# Patient Record
Sex: Male | Born: 1980
Health system: Southern US, Community
[De-identification: ages and names within clinical notes are randomized; demographics above are authoritative.]

## PROBLEM LIST (undated history)

## (undated) DIAGNOSIS — K759 Inflammatory liver disease, unspecified: Secondary | ICD-10-CM

## (undated) DIAGNOSIS — G9340 Encephalopathy, unspecified: Secondary | ICD-10-CM

## (undated) DIAGNOSIS — Z87442 Personal history of urinary calculi: Secondary | ICD-10-CM

## (undated) DIAGNOSIS — I2510 Atherosclerotic heart disease of native coronary artery without angina pectoris: Secondary | ICD-10-CM

## (undated) DIAGNOSIS — G4730 Sleep apnea, unspecified: Secondary | ICD-10-CM

## (undated) DIAGNOSIS — R569 Unspecified convulsions: Secondary | ICD-10-CM

## (undated) DIAGNOSIS — R002 Palpitations: Secondary | ICD-10-CM

## (undated) DIAGNOSIS — I10 Essential (primary) hypertension: Secondary | ICD-10-CM

---

## 2010-01-23 ENCOUNTER — Inpatient Hospital Stay (HOSPITAL_COMMUNITY): Admission: EM | Admit: 2010-01-23 | Discharge: 2010-01-26 | Payer: Self-pay | Admitting: Emergency Medicine

## 2010-01-23 ENCOUNTER — Ambulatory Visit: Payer: Self-pay | Admitting: Infectious Disease

## 2010-01-25 ENCOUNTER — Encounter (INDEPENDENT_AMBULATORY_CARE_PROVIDER_SITE_OTHER): Payer: Self-pay | Admitting: Internal Medicine

## 2010-02-15 ENCOUNTER — Ambulatory Visit: Payer: Self-pay | Admitting: Infectious Disease

## 2010-02-15 DIAGNOSIS — G0491 Myelitis, unspecified: Secondary | ICD-10-CM

## 2010-02-15 DIAGNOSIS — G0490 Encephalitis and encephalomyelitis, unspecified: Secondary | ICD-10-CM | POA: Insufficient documentation

## 2010-02-15 DIAGNOSIS — Z8669 Personal history of other diseases of the nervous system and sense organs: Secondary | ICD-10-CM

## 2010-02-15 DIAGNOSIS — B2790 Infectious mononucleosis, unspecified without complication: Secondary | ICD-10-CM

## 2010-02-15 DIAGNOSIS — B199 Unspecified viral hepatitis without hepatic coma: Secondary | ICD-10-CM | POA: Insufficient documentation

## 2010-02-15 LAB — CONVERTED CEMR LAB
ALT: 57 units/L — ABNORMAL HIGH (ref 0–53)
AST: 25 units/L (ref 0–37)
Albumin: 4.5 g/dL (ref 3.5–5.2)
Alkaline Phosphatase: 71 units/L (ref 39–117)
BUN: 17 mg/dL (ref 6–23)
Basophils Absolute: 0 10*3/uL (ref 0.0–0.1)
CO2: 23 meq/L (ref 19–32)
Calcium: 9.4 mg/dL (ref 8.4–10.5)
Chloride: 100 meq/L (ref 96–112)
Eosinophils Absolute: 0.3 10*3/uL (ref 0.0–0.7)
Eosinophils Relative: 3 % (ref 0–5)
HCT: 42.2 % (ref 39.0–52.0)
Hemoglobin: 14.3 g/dL (ref 13.0–17.0)
MCHC: 33.9 g/dL (ref 30.0–36.0)
MCV: 86.7 fL (ref >=78.0)
Monocytes Absolute: 0.9 10*3/uL (ref 0.1–1.0)
Sodium: 137 meq/L (ref 135–145)
Total Bilirubin: 1.1 mg/dL (ref 0.3–1.2)

## 2010-02-17 ENCOUNTER — Telehealth (INDEPENDENT_AMBULATORY_CARE_PROVIDER_SITE_OTHER): Payer: Self-pay | Admitting: *Deleted

## 2010-12-21 NOTE — Progress Notes (Signed)
Summary: pt given lab results  Phone Note Call from Patient Call back at Home Phone [phone]   Caller: Patient Reason for Call: Lab or Test Results Summary of Call: gave pt. CBC and CMET results Wendall Mola CMA Duncan Dull)  February 17, 2010 4:52 PM  Initial call taken by: Wendall Mola CMA Duncan Dull),  February 17, 2010 4:52 PM
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
## 2010-12-21 NOTE — Letter (Signed)
Summary: Generic Letter  All     ,     Phone:   Fax:     02/15/2010  Ann Held [address] [place], [place]  [address]  To whom it may concern:  Mr Gulbranson has been seen and evaluated and treated by me both in the hospital and in my clinic. He was suitable to return to work on March the 2nd. If you have any further questions please do not hesitate to contact my office          Sincerely,   Acey Lav MD

## 2011-02-14 LAB — BASIC METABOLIC PANEL
BUN: 11 mg/dL (ref 6–23)
CO2: 23 mEq/L (ref 19–32)
Chloride: 100 mEq/L (ref 96–112)
Glucose, Bld: 138 mg/dL — ABNORMAL HIGH (ref 70–99)
Potassium: 4.2 mEq/L (ref 3.5–5.1)

## 2011-02-14 LAB — COMPREHENSIVE METABOLIC PANEL
ALT: 580 U/L — ABNORMAL HIGH (ref 0–53)
AST: 150 U/L — ABNORMAL HIGH (ref 0–37)
AST: 180 U/L — ABNORMAL HIGH (ref 0–37)
Albumin: 3.1 g/dL — ABNORMAL LOW (ref 3.5–5.2)
Alkaline Phosphatase: 226 U/L — ABNORMAL HIGH (ref 39–117)
Alkaline Phosphatase: 245 U/L — ABNORMAL HIGH (ref 39–117)
Alkaline Phosphatase: 270 U/L — ABNORMAL HIGH (ref 39–117)
BUN: 10 mg/dL (ref 6–23)
BUN: 9 mg/dL (ref 6–23)
CO2: 27 mEq/L (ref 19–32)
CO2: 30 mEq/L (ref 19–32)
Calcium: 9 mg/dL (ref 8.4–10.5)
Chloride: 102 mEq/L (ref 96–112)
Chloride: 103 mEq/L (ref 96–112)
Chloride: 104 mEq/L (ref 96–112)
Creatinine, Ser: 0.77 mg/dL (ref 0.4–1.5)
GFR calc Af Amer: >60 mL/min (ref >60)
GFR calc Af Amer: >60 mL/min (ref >60)
GFR calc non Af Amer: >60 mL/min (ref >60)
GFR calc non Af Amer: >60 mL/min (ref >60)
Glucose, Bld: 109 mg/dL — ABNORMAL HIGH (ref 70–99)
Potassium: 3.9 mEq/L (ref 3.5–5.1)
Potassium: 4 mEq/L (ref 3.5–5.1)
Potassium: 4.2 mEq/L (ref 3.5–5.1)
Sodium: 136 mEq/L (ref 135–145)
Sodium: 140 mEq/L (ref 135–145)
Total Bilirubin: 1.7 mg/dL — ABNORMAL HIGH (ref 0.3–1.2)
Total Bilirubin: 1.8 mg/dL — ABNORMAL HIGH (ref 0.3–1.2)
Total Bilirubin: 2.8 mg/dL — ABNORMAL HIGH (ref 0.3–1.2)
Total Protein: 6.2 g/dL (ref 6.0–8.3)

## 2011-02-14 LAB — RAPID URINE DRUG SCREEN, HOSP PERFORMED
Barbiturates: NOT DETECTED
Benzodiazepines: POSITIVE — AB
Cocaine: NOT DETECTED
Opiates: NOT DETECTED

## 2011-02-14 LAB — URINE MICROSCOPIC-ADD ON

## 2011-02-14 LAB — POCT I-STAT, CHEM 8
Chloride: 103 mEq/L (ref 96–112)
Creatinine, Ser: 1.2 mg/dL (ref 0.4–1.5)
Glucose, Bld: 142 mg/dL — ABNORMAL HIGH (ref 70–99)
Potassium: 4 mEq/L (ref 3.5–5.1)
TCO2: 26 mmol/L (ref 0–100)

## 2011-02-14 LAB — DIFFERENTIAL
Basophils Absolute: 0 10*3/uL (ref 0.0–0.1)
Basophils Absolute: 0.2 10*3/uL — ABNORMAL HIGH (ref 0.0–0.1)
Basophils Relative: 0 % (ref 0–1)
Eosinophils Absolute: 0 10*3/uL (ref 0.0–0.7)
Eosinophils Absolute: 0 10*3/uL (ref 0.0–0.7)
Lymphocytes Relative: 67 % — ABNORMAL HIGH (ref 12–46)
Lymphs Abs: 8 10*3/uL — ABNORMAL HIGH (ref 0.7–4.0)
Monocytes Absolute: 0.7 10*3/uL (ref 0.1–1.0)
Monocytes Relative: 4 % (ref 3–12)
Neutro Abs: 4.8 10*3/uL (ref 1.7–7.7)
Neutro Abs: 7.3 10*3/uL (ref 1.7–7.7)
Neutrophils Relative %: 24 % — ABNORMAL LOW (ref 43–77)
Promyelocytes Absolute: 0 %

## 2011-02-14 LAB — EPSTEIN-BARR VIRUS VCA ANTIBODY PANEL
EBV VCA IgG: 0.59 {ISR}
EBV VCA IgM: 4.08 {ISR} — ABNORMAL HIGH

## 2011-02-14 LAB — CBC
HCT: 41.9 % (ref 39.0–52.0)
HCT: 42.1 % (ref 39.0–52.0)
HCT: 49 % (ref 39.0–52.0)
Hemoglobin: 14.7 g/dL (ref 13.0–17.0)
MCHC: 33.9 g/dL (ref 30.0–36.0)
MCHC: 35 g/dL (ref 30.0–36.0)
MCHC: 35.1 g/dL (ref 30.0–36.0)
MCV: 88.7 fL (ref 78.0–100.0)
MCV: 89 fL (ref 78.0–100.0)
MCV: 89.5 fL (ref 78.0–100.0)
Platelets: 268 10*3/uL (ref 150–400)
Platelets: 293 10*3/uL (ref 150–400)
Platelets: 346 10*3/uL (ref 150–400)
RBC: 4.73 MIL/uL (ref 4.22–5.81)
RDW: 13.6 % (ref 11.5–15.5)
WBC: 16.3 10*3/uL — ABNORMAL HIGH (ref 4.0–10.5)
WBC: 16.7 10*3/uL — ABNORMAL HIGH (ref 4.0–10.5)
WBC: 24.7 10*3/uL — ABNORMAL HIGH (ref 4.0–10.5)

## 2011-02-14 LAB — HIV-1 RNA QUANT-NO REFLEX-BLD
HIV 1 RNA Quant: <48 copies/mL (ref <48)
HIV-1 RNA Quant, Log: <1.68 {Log} (ref <1.68)

## 2011-02-14 LAB — GRAM STAIN

## 2011-02-14 LAB — MAGNESIUM
Magnesium: 1.9 mg/dL (ref 1.5–2.5)
Magnesium: 2 mg/dL (ref 1.5–2.5)

## 2011-02-14 LAB — PHOSPHORUS
Phosphorus: 3.1 mg/dL (ref 2.3–4.6)
Phosphorus: 3.4 mg/dL (ref 2.3–4.6)

## 2011-02-14 LAB — CMV ANTIBODY, IGG (EIA): CMV Ab - IgG: <0.2 IU/mL (ref <0.4)

## 2011-02-14 LAB — HIV ANTIBODY (ROUTINE TESTING W REFLEX): HIV: NONREACTIVE

## 2011-02-14 LAB — URINALYSIS, ROUTINE W REFLEX MICROSCOPIC: Ketones, ur: 15 mg/dL — AB

## 2011-02-14 LAB — GLUCOSE, CAPILLARY
Glucose-Capillary: 127 mg/dL — ABNORMAL HIGH (ref 70–99)
Glucose-Capillary: 227 mg/dL — ABNORMAL HIGH (ref 70–99)
Glucose-Capillary: 242 mg/dL — ABNORMAL HIGH (ref 70–99)

## 2011-02-14 LAB — URINE CULTURE
Colony Count: NO GROWTH
Culture: NO GROWTH

## 2011-02-14 LAB — HEPATITIS PANEL, ACUTE
HCV Ab: NEGATIVE
Hep A IgM: NEGATIVE
Hep B C IgM: NEGATIVE
Hepatitis B Surface Ag: NEGATIVE

## 2011-02-14 LAB — HEPATIC FUNCTION PANEL
Alkaline Phosphatase: 269 U/L — ABNORMAL HIGH (ref 39–117)
Bilirubin, Direct: 3.1 mg/dL — ABNORMAL HIGH (ref 0.0–0.3)
Total Bilirubin: 4.9 mg/dL — ABNORMAL HIGH (ref 0.3–1.2)

## 2011-02-14 LAB — LACTIC ACID, PLASMA
Lactic Acid, Venous: 1.2 mmol/L (ref 0.5–2.2)
Lactic Acid, Venous: 3.3 mmol/L — ABNORMAL HIGH (ref 0.5–2.2)

## 2011-02-14 LAB — TSH: TSH: 1.182 u[IU]/mL (ref 0.350–4.500)

## 2011-02-14 LAB — CMV IGM: CMV IgM: <8 AU/mL (ref <30.0)

## 2011-02-14 LAB — HEPATITIS B SURFACE ANTIBODY,QUALITATIVE: Hep B S Ab: POSITIVE — AB

## 2011-02-14 LAB — HEPATITIS B SURFACE ANTIGEN: Hepatitis B Surface Ag: NEGATIVE

## 2011-02-14 LAB — EPSTEIN BARR VRS(EBV DNA BY PCR): EBV DNA QN by PCR: <200 copies/mL (ref <200)

## 2011-02-14 LAB — CYTOMEGALOVIRUS PCR, QUALITATIVE: Cytomegalovirus DNA: NOT DETECTED

## 2011-02-14 LAB — APTT: aPTT: 32 seconds (ref 24–37)

## 2011-02-14 LAB — PROTEIN AND GLUCOSE, CSF: Glucose, CSF: 77 mg/dL — ABNORMAL HIGH (ref 43–76)

## 2011-02-14 LAB — PROTIME-INR: INR: 1 (ref 0.00–1.49)

## 2011-02-14 LAB — CSF CELL COUNT WITH DIFFERENTIAL
Lymphs, CSF: 95 % — ABNORMAL HIGH (ref 40–80)
Monocyte-Macrophage-Spinal Fluid: 5 % — ABNORMAL LOW (ref 15–45)

## 2011-02-14 LAB — CSF CULTURE W GRAM STAIN

## 2011-07-29 ENCOUNTER — Other Ambulatory Visit: Payer: Self-pay | Admitting: Orthopedic Surgery

## 2011-08-02 ENCOUNTER — Other Ambulatory Visit: Payer: Self-pay | Admitting: Orthopedic Surgery

## 2011-08-02 DIAGNOSIS — M25531 Pain in right wrist: Secondary | ICD-10-CM

## 2011-08-09 ENCOUNTER — Other Ambulatory Visit: Payer: Self-pay

## 2011-08-22 ENCOUNTER — Ambulatory Visit
Admission: RE | Admit: 2011-08-22 | Discharge: 2011-08-22 | Disposition: A | Discharge status: Home / Self Care | Payer: PRIVATE HEALTH INSURANCE | Source: Ambulatory Visit | Attending: Orthopedic Surgery | Admitting: Orthopedic Surgery

## 2011-08-22 DIAGNOSIS — M25531 Pain in right wrist: Secondary | ICD-10-CM

## 2012-07-28 IMAGING — CT CT WRIST*R* W/O CM
4 series · 16 of 33 positions shown, 19 images · non-contrast
Comparison: Radiographs 12/27/2010.

CLINICAL DATA: Scaphoid fracture 9 months ago with decreased range
of motion for 10 months.  No previous relevant surgery.

CT OF THE RIGHT WRIST WITHOUT CONTRAST
TECHNIQUE: Multidetector CT imaging was performed according to the
standard protocol. Multiplanar CT image reconstructions were also
generated.

[Series 3: upper ext bone · axial · 0.23mm/px · z∈[-53,+62]mm · 5 of 70 slices shown, 7 images]
[im 12/70  soft-tissue]
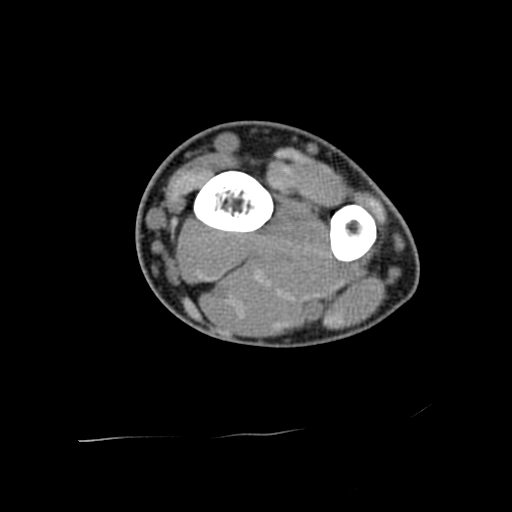
[im 12/70  bone]
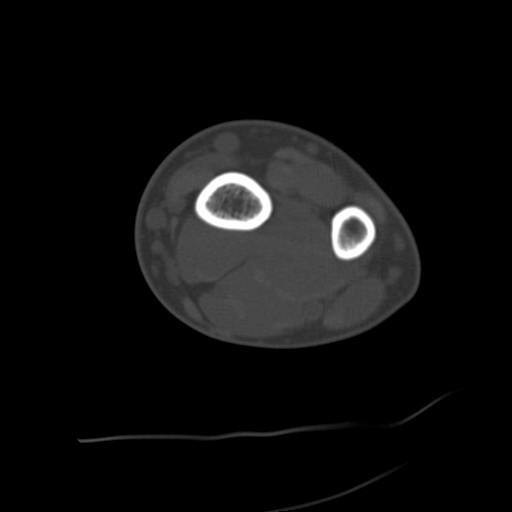
[im 24/70  bone]
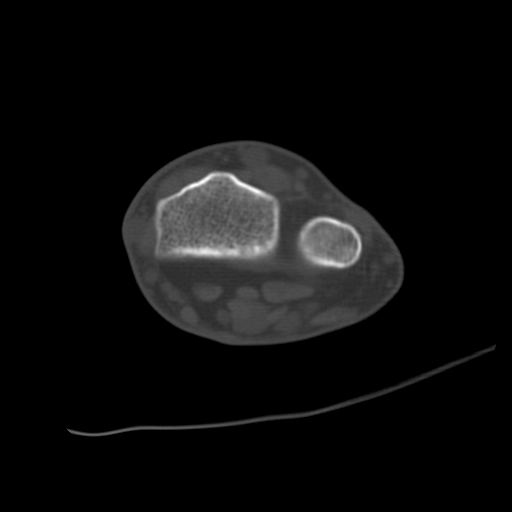
[im 35/70  bone]
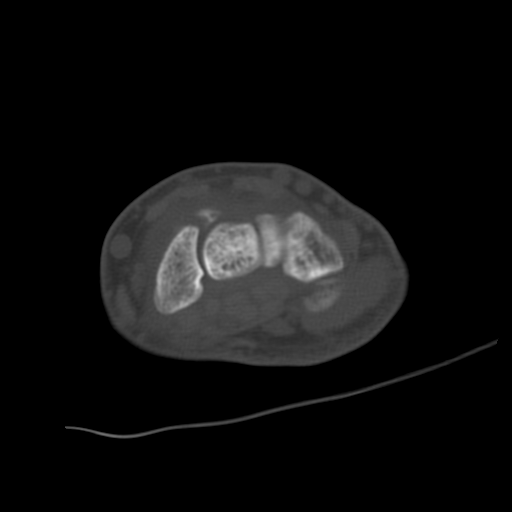
[im 47/70  bone]
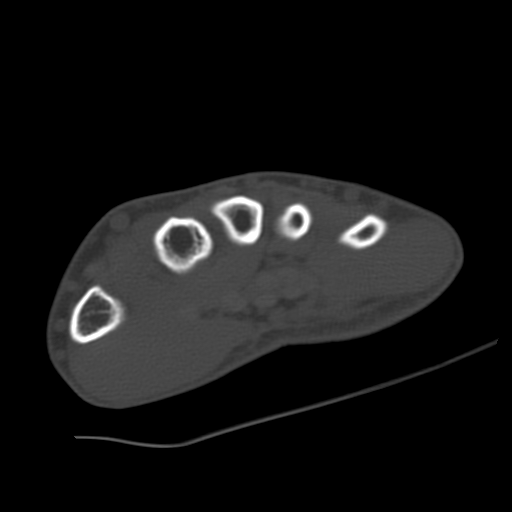
[im 58/70  soft-tissue]
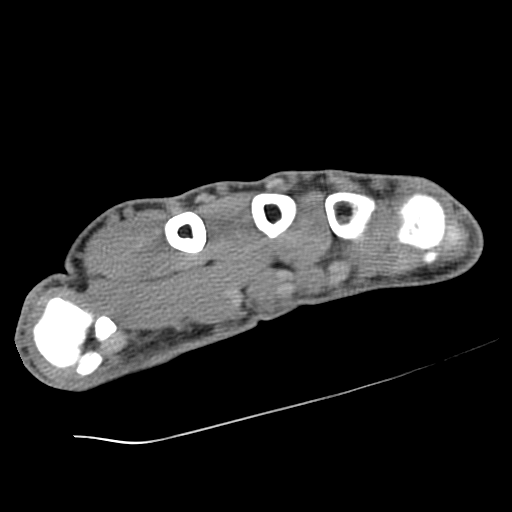
[im 58/70  bone]
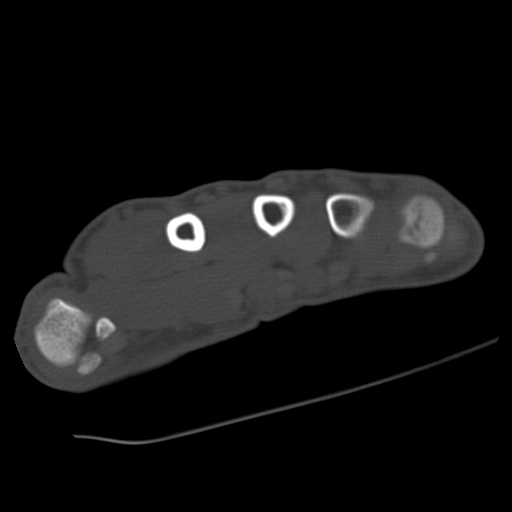

[Series 4: upper ext soft · axial · 0.23mm/px · z∈[-53,+5]mm · 3 of 70 slices shown]
[im 12/70  soft-tissue]
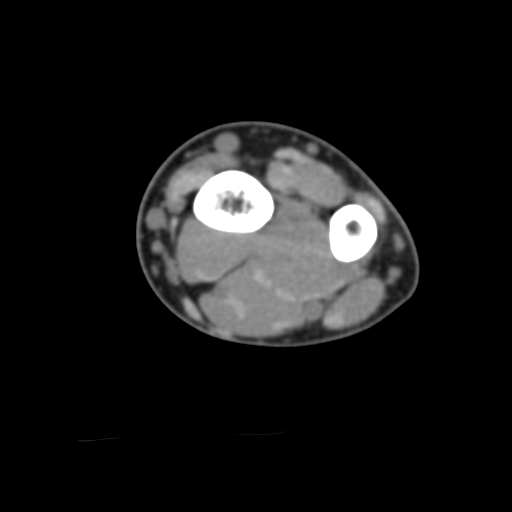
[im 24/70  soft-tissue]
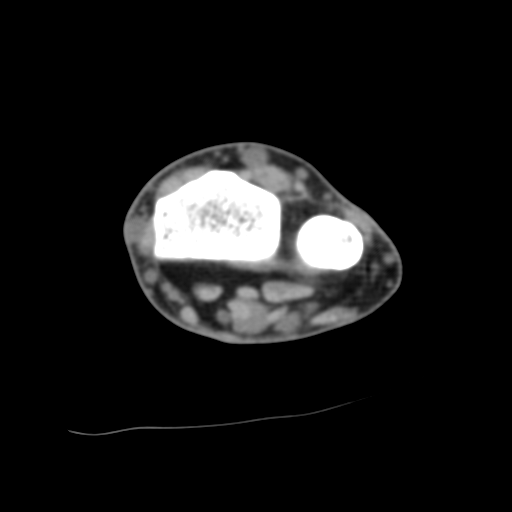
[im 35/70  soft-tissue]
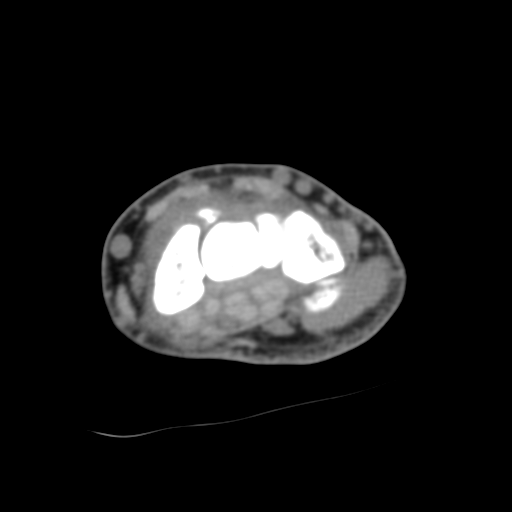

[Series 200: cor · coronal · 0.35mm/px · 3 of 35 slices shown]
[im 7/35  bone]
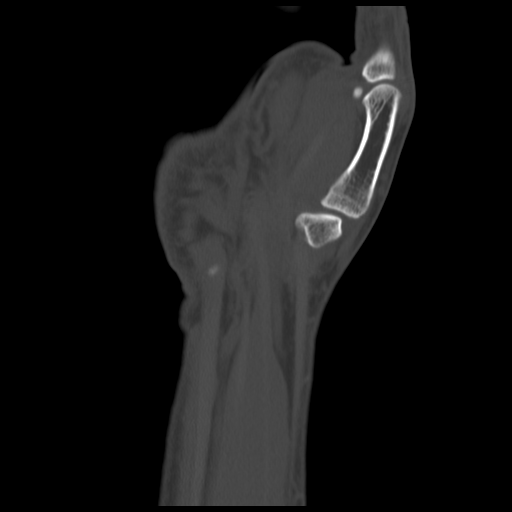
[im 14/35  bone]
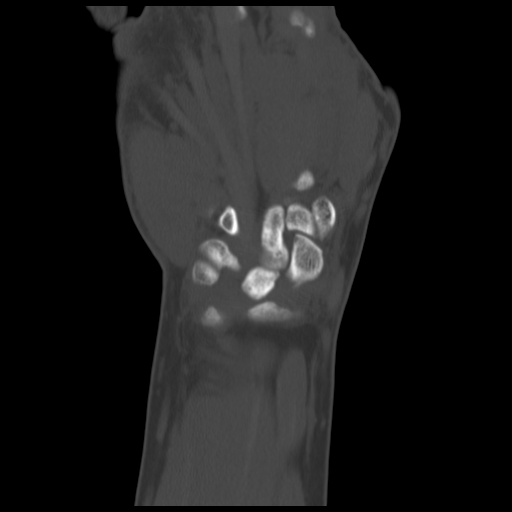
[im 21/35  bone]
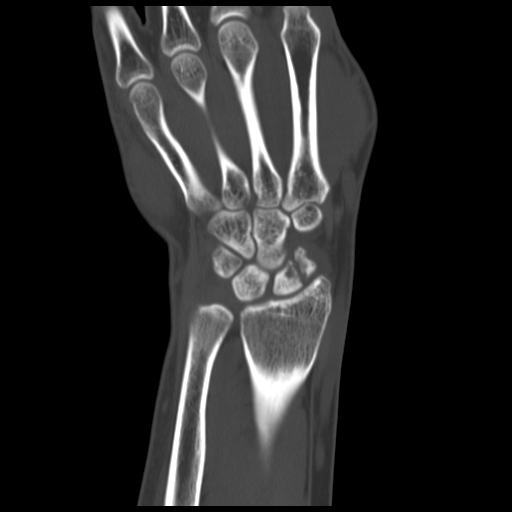

[Series 201: sag · sagittal · 0.35mm/px · 5 of 67 slices shown, 6 images]
[im 23/67  bone]
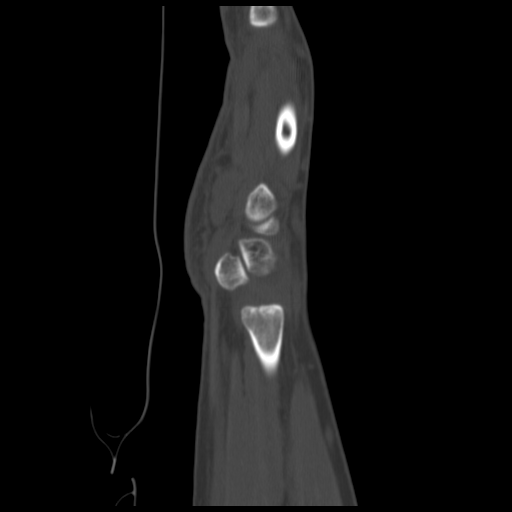
[im 28/67  bone]
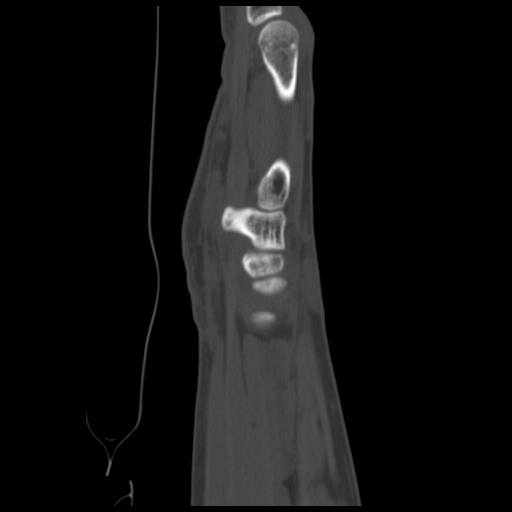
[im 34/67  soft-tissue]
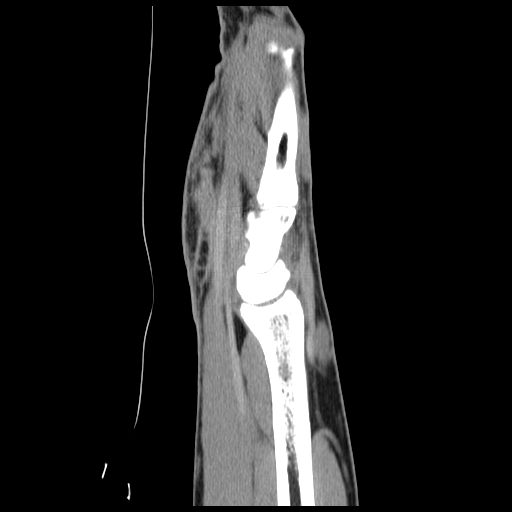
[im 34/67  bone]
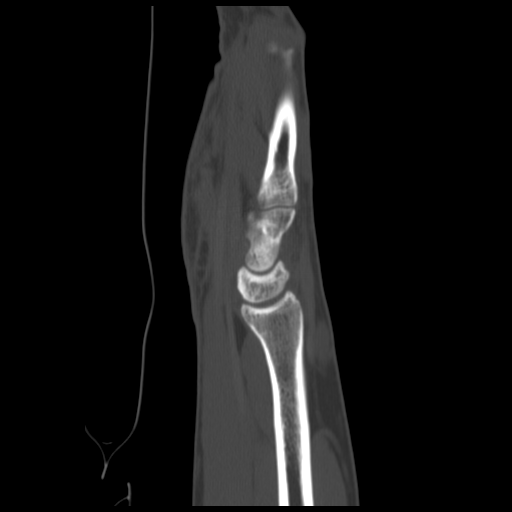
[im 39/67  bone]
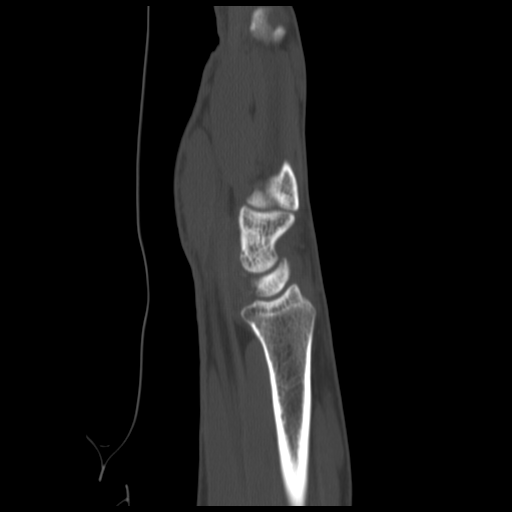
[im 45/67  bone]
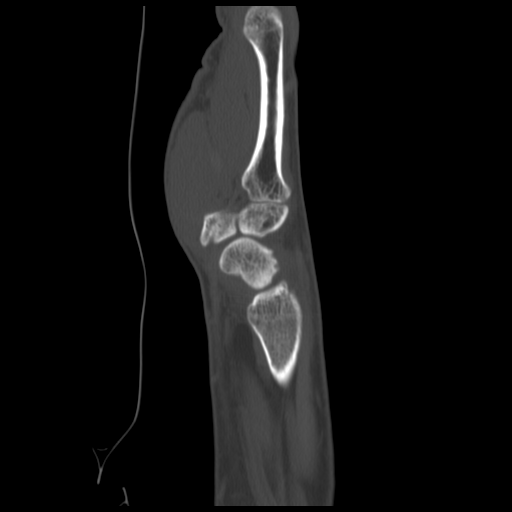

[16 of 33 positions shown; findings below may reference images not displayed]

FINDINGS: There is a mildly displaced transverse fracture through
the scaphoid waist. This fracture demonstrates sclerotic margins,
suspicious for nonunion given the reported date of injury in
[REDACTED].  There is cystic change and mild sclerosis within the
proximal pole.  No subchondral collapse is identified.  There is no
widening of the scapholunate interval.  The carpal bone alignment
is normal.

The additional carpal bones appear normal.  The hamate hook is
intact.  No periarticular soft tissue findings are identified.
IMPRESSION: 1.  Mildly displaced transverse fracture through the scaphoid waist
has sclerotic margins suspicious for nonunion.
2.  Sclerosis and cystic change within the proximal pole could
indicate osteonecrosis.
3.  No carpal bone malalignment demonstrated.

## 2017-01-05 DIAGNOSIS — J00 Acute nasopharyngitis [common cold]: Secondary | ICD-10-CM | POA: Diagnosis not present

## 2017-01-05 DIAGNOSIS — H66003 Acute suppurative otitis media without spontaneous rupture of ear drum, bilateral: Secondary | ICD-10-CM | POA: Diagnosis not present

## 2017-01-05 DIAGNOSIS — J029 Acute pharyngitis, unspecified: Secondary | ICD-10-CM | POA: Diagnosis not present

## 2017-02-21 DIAGNOSIS — E291 Testicular hypofunction: Secondary | ICD-10-CM | POA: Diagnosis not present

## 2017-02-24 DIAGNOSIS — Z7952 Long term (current) use of systemic steroids: Secondary | ICD-10-CM | POA: Diagnosis not present

## 2017-02-24 DIAGNOSIS — E291 Testicular hypofunction: Secondary | ICD-10-CM | POA: Diagnosis not present

## 2017-09-12 DIAGNOSIS — E291 Testicular hypofunction: Secondary | ICD-10-CM | POA: Diagnosis not present

## 2017-09-12 DIAGNOSIS — Z7952 Long term (current) use of systemic steroids: Secondary | ICD-10-CM | POA: Diagnosis not present

## 2017-09-15 DIAGNOSIS — M722 Plantar fascial fibromatosis: Secondary | ICD-10-CM | POA: Diagnosis not present

## 2017-12-03 DIAGNOSIS — H66002 Acute suppurative otitis media without spontaneous rupture of ear drum, left ear: Secondary | ICD-10-CM | POA: Diagnosis not present

## 2017-12-03 DIAGNOSIS — Z23 Encounter for immunization: Secondary | ICD-10-CM | POA: Diagnosis not present

## 2017-12-03 DIAGNOSIS — J0140 Acute pansinusitis, unspecified: Secondary | ICD-10-CM | POA: Diagnosis not present

## 2018-02-27 DIAGNOSIS — E291 Testicular hypofunction: Secondary | ICD-10-CM | POA: Diagnosis not present

## 2018-03-13 DIAGNOSIS — Z7952 Long term (current) use of systemic steroids: Secondary | ICD-10-CM | POA: Diagnosis not present

## 2018-03-13 DIAGNOSIS — E559 Vitamin D deficiency, unspecified: Secondary | ICD-10-CM | POA: Diagnosis not present

## 2018-03-13 DIAGNOSIS — E291 Testicular hypofunction: Secondary | ICD-10-CM | POA: Diagnosis not present

## 2018-04-03 DIAGNOSIS — M5442 Lumbago with sciatica, left side: Secondary | ICD-10-CM | POA: Diagnosis not present

## 2018-09-04 DIAGNOSIS — E291 Testicular hypofunction: Secondary | ICD-10-CM | POA: Diagnosis not present

## 2018-09-07 DIAGNOSIS — Z7952 Long term (current) use of systemic steroids: Secondary | ICD-10-CM | POA: Diagnosis not present

## 2018-09-07 DIAGNOSIS — E291 Testicular hypofunction: Secondary | ICD-10-CM | POA: Diagnosis not present

## 2018-10-02 DIAGNOSIS — M62838 Other muscle spasm: Secondary | ICD-10-CM | POA: Diagnosis not present

## 2018-10-02 DIAGNOSIS — R030 Elevated blood-pressure reading, without diagnosis of hypertension: Secondary | ICD-10-CM | POA: Diagnosis not present

## 2018-10-03 DIAGNOSIS — M9901 Segmental and somatic dysfunction of cervical region: Secondary | ICD-10-CM | POA: Diagnosis not present

## 2018-10-03 DIAGNOSIS — M4692 Unspecified inflammatory spondylopathy, cervical region: Secondary | ICD-10-CM | POA: Diagnosis not present

## 2018-10-03 DIAGNOSIS — M531 Cervicobrachial syndrome: Secondary | ICD-10-CM | POA: Diagnosis not present

## 2018-10-03 DIAGNOSIS — M9902 Segmental and somatic dysfunction of thoracic region: Secondary | ICD-10-CM | POA: Diagnosis not present

## 2018-10-06 DIAGNOSIS — Z23 Encounter for immunization: Secondary | ICD-10-CM | POA: Diagnosis not present

## 2018-10-09 DIAGNOSIS — M531 Cervicobrachial syndrome: Secondary | ICD-10-CM | POA: Diagnosis not present

## 2018-10-09 DIAGNOSIS — M9902 Segmental and somatic dysfunction of thoracic region: Secondary | ICD-10-CM | POA: Diagnosis not present

## 2018-10-09 DIAGNOSIS — M9901 Segmental and somatic dysfunction of cervical region: Secondary | ICD-10-CM | POA: Diagnosis not present

## 2018-10-09 DIAGNOSIS — M5013 Cervical disc disorder with radiculopathy, cervicothoracic region: Secondary | ICD-10-CM | POA: Diagnosis not present

## 2018-10-16 DIAGNOSIS — M9901 Segmental and somatic dysfunction of cervical region: Secondary | ICD-10-CM | POA: Diagnosis not present

## 2018-10-16 DIAGNOSIS — M9902 Segmental and somatic dysfunction of thoracic region: Secondary | ICD-10-CM | POA: Diagnosis not present

## 2018-10-16 DIAGNOSIS — M531 Cervicobrachial syndrome: Secondary | ICD-10-CM | POA: Diagnosis not present

## 2018-10-17 DIAGNOSIS — M5013 Cervical disc disorder with radiculopathy, cervicothoracic region: Secondary | ICD-10-CM | POA: Diagnosis not present

## 2018-10-23 DIAGNOSIS — M5013 Cervical disc disorder with radiculopathy, cervicothoracic region: Secondary | ICD-10-CM | POA: Diagnosis not present

## 2018-10-31 DIAGNOSIS — M5013 Cervical disc disorder with radiculopathy, cervicothoracic region: Secondary | ICD-10-CM | POA: Diagnosis not present

## 2018-11-07 DIAGNOSIS — M5013 Cervical disc disorder with radiculopathy, cervicothoracic region: Secondary | ICD-10-CM | POA: Diagnosis not present

## 2018-11-21 HISTORY — PX: INGUINAL HERNIA REPAIR: SUR1180

## 2018-11-21 HISTORY — PX: SPINAL FUSION: SHX223

## 2018-11-22 DIAGNOSIS — M5013 Cervical disc disorder with radiculopathy, cervicothoracic region: Secondary | ICD-10-CM | POA: Diagnosis not present

## 2018-12-04 DIAGNOSIS — M5013 Cervical disc disorder with radiculopathy, cervicothoracic region: Secondary | ICD-10-CM | POA: Diagnosis not present

## 2018-12-13 DIAGNOSIS — M542 Cervicalgia: Secondary | ICD-10-CM | POA: Diagnosis not present

## 2018-12-25 DIAGNOSIS — M5412 Radiculopathy, cervical region: Secondary | ICD-10-CM | POA: Diagnosis not present

## 2019-01-12 DIAGNOSIS — J181 Lobar pneumonia, unspecified organism: Secondary | ICD-10-CM | POA: Diagnosis not present

## 2019-01-12 DIAGNOSIS — H66001 Acute suppurative otitis media without spontaneous rupture of ear drum, right ear: Secondary | ICD-10-CM | POA: Diagnosis not present

## 2019-01-22 DIAGNOSIS — R05 Cough: Secondary | ICD-10-CM | POA: Diagnosis not present

## 2019-01-28 DIAGNOSIS — M5412 Radiculopathy, cervical region: Secondary | ICD-10-CM | POA: Diagnosis not present

## 2019-01-29 DIAGNOSIS — M50123 Cervical disc disorder at C6-C7 level with radiculopathy: Secondary | ICD-10-CM | POA: Diagnosis not present

## 2019-01-29 DIAGNOSIS — M5412 Radiculopathy, cervical region: Secondary | ICD-10-CM | POA: Diagnosis not present

## 2019-08-30 ENCOUNTER — Other Ambulatory Visit: Payer: Self-pay | Admitting: Gastroenterology

## 2019-08-30 DIAGNOSIS — R7989 Other specified abnormal findings of blood chemistry: Secondary | ICD-10-CM

## 2019-09-10 ENCOUNTER — Ambulatory Visit
Admission: RE | Admit: 2019-09-10 | Discharge: 2019-09-10 | Disposition: A | Discharge status: Home / Self Care | Payer: PRIVATE HEALTH INSURANCE | Source: Ambulatory Visit | Attending: Gastroenterology | Admitting: Gastroenterology

## 2019-09-10 DIAGNOSIS — R7989 Other specified abnormal findings of blood chemistry: Secondary | ICD-10-CM

## 2019-09-20 ENCOUNTER — Other Ambulatory Visit (HOSPITAL_COMMUNITY): Payer: Self-pay | Admitting: Gastroenterology

## 2019-09-20 DIAGNOSIS — K760 Fatty (change of) liver, not elsewhere classified: Secondary | ICD-10-CM

## 2019-09-20 DIAGNOSIS — R7989 Other specified abnormal findings of blood chemistry: Secondary | ICD-10-CM

## 2019-09-20 DIAGNOSIS — E8801 Alpha-1-antitrypsin deficiency: Secondary | ICD-10-CM

## 2019-09-20 DIAGNOSIS — R945 Abnormal results of liver function studies: Secondary | ICD-10-CM

## 2019-10-08 ENCOUNTER — Other Ambulatory Visit: Payer: Self-pay | Admitting: Radiology

## 2019-10-09 ENCOUNTER — Ambulatory Visit (HOSPITAL_COMMUNITY)
Admission: RE | Admit: 2019-10-09 | Discharge: 2019-10-09 | Disposition: A | Discharge status: Home / Self Care | Payer: 59 | Source: Ambulatory Visit | Attending: Gastroenterology | Admitting: Gastroenterology

## 2019-10-09 ENCOUNTER — Other Ambulatory Visit: Payer: Self-pay

## 2019-10-09 DIAGNOSIS — Z79899 Other long term (current) drug therapy: Secondary | ICD-10-CM | POA: Diagnosis not present

## 2019-10-09 DIAGNOSIS — E8801 Alpha-1-antitrypsin deficiency: Secondary | ICD-10-CM | POA: Diagnosis not present

## 2019-10-09 DIAGNOSIS — Z880 Allergy status to penicillin: Secondary | ICD-10-CM | POA: Insufficient documentation

## 2019-10-09 DIAGNOSIS — R945 Abnormal results of liver function studies: Secondary | ICD-10-CM | POA: Insufficient documentation

## 2019-10-09 DIAGNOSIS — Z882 Allergy status to sulfonamides status: Secondary | ICD-10-CM | POA: Insufficient documentation

## 2019-10-09 DIAGNOSIS — R7989 Other specified abnormal findings of blood chemistry: Secondary | ICD-10-CM

## 2019-10-09 DIAGNOSIS — K760 Fatty (change of) liver, not elsewhere classified: Secondary | ICD-10-CM | POA: Insufficient documentation

## 2019-10-09 LAB — CBC
HCT: 48.7 % (ref 39.0–52.0)
Hemoglobin: 17 g/dL (ref 13.0–17.0)
MCH: 31.8 pg (ref 26.0–34.0)
MCHC: 34.9 g/dL (ref 30.0–36.0)
MCV: 91.2 fL (ref 80.0–100.0)
Platelets: 315 10*3/uL (ref 150–400)
RBC: 5.34 MIL/uL (ref 4.22–5.81)
RDW: 12.5 % (ref 11.5–15.5)
WBC: 6.4 10*3/uL (ref 4.0–10.5)
nRBC: 0 % (ref 0.0–0.2)

## 2019-10-09 LAB — PROTIME-INR
INR: 1 (ref 0.8–1.2)
Prothrombin Time: 12.8 seconds (ref 11.4–15.2)

## 2019-10-09 MED ORDER — LIDOCAINE HCL (PF) 1 % IJ SOLN
INTRAMUSCULAR | Status: AC
  Filled 2019-10-09: qty 30

## 2019-10-09 MED ORDER — SODIUM CHLORIDE 0.9 % IV SOLN: INTRAVENOUS | Status: DC

## 2019-10-09 MED ORDER — MIDAZOLAM HCL 2 MG/2ML IJ SOLN
INTRAMUSCULAR | Status: AC
  Filled 2019-10-09: qty 2

## 2019-10-09 MED ORDER — MIDAZOLAM HCL 2 MG/2ML IJ SOLN
INTRAMUSCULAR | Status: AC | PRN
  Administered 2019-10-09: 1 mg via INTRAVENOUS

## 2019-10-09 MED ORDER — GELATIN ABSORBABLE 12-7 MM EX MISC
CUTANEOUS | Status: AC
  Filled 2019-10-09: qty 1

## 2019-10-09 MED ORDER — FENTANYL CITRATE (PF) 100 MCG/2ML IJ SOLN
INTRAMUSCULAR | Status: AC | PRN
  Administered 2019-10-09: 25 ug via INTRAVENOUS

## 2019-10-09 MED ORDER — FENTANYL CITRATE (PF) 100 MCG/2ML IJ SOLN
INTRAMUSCULAR | Status: AC
  Filled 2019-10-09: qty 2

## 2019-10-09 NOTE — H&P (Signed)
Chief Complaint: Patient was seen in consultation today for liver biopsy at the request of Ronnette Juniper  Referring Physician(s): Ronnette Juniper  Supervising Physician: Corrie Mckusick  Patient Status: [facility]  History of Present Illness: Erik Murphy is a 38 y.o. male being worked up for elevated LFTs. Hx of Mono and more recent viral illness. He is referred for random liver biopsy. PMHx, meds, labs, imaging, allergies reviewed. Feels well, no recent fevers, chills, illness. Has been NPO today as directed.   No past medical history on file.    Allergies: Sulfonamide derivatives and Amoxicillin  Medications: Prior to Admission medications   Medication Sig Start Date End Date Taking? Authorizing Provider  Testosterone Cypionate 200 MG/ML KIT Inject 0.6 mLs into the muscle once a week. Wednesdays 11/24/16  Yes [provider]  cholecalciferol (VITAMIN D3) 25 MCG (1000 UT) tablet Take 1,000 Units by mouth 3 (three) times a week.    [provider]     No family history on file.  Social History   Socioeconomic History  . Marital status: Single    Spouse name: Not on file  . Number of children: Not on file  . Years of education: Not on file  . Highest education level: Not on file  Occupational History  . Not on file  Social Needs  . Financial resource strain: Not on file  . Food insecurity    Worry: Not on file    Inability: Not on file  . Transportation needs    Medical: Not on file    Non-medical: Not on file  Tobacco Use  . Smoking status: Not on file  Substance and Sexual Activity  . Alcohol use: Not on file  . Drug use: Not on file  . Sexual activity: Not on file  Lifestyle  . Physical activity    Days per week: Not on file    Minutes per session: Not on file  . Stress: Not on file  Relationships  . Social Herbalist on phone: Not on file    Gets together: Not on file    Attends religious service: Not on file    Active  member of club or organization: Not on file    Attends meetings of clubs or organizations: Not on file    Relationship status: Not on file  Other Topics Concern  . Not on file  Social History Narrative  . Not on file    Review of Systems: A 12 point ROS discussed and pertinent positives are indicated in the HPI above.  All other systems are negative.  Review of Systems  Vital Signs: BP 129/80   Pulse 72   Temp (!) 97.3 F (36.3 C) (Skin)   Resp 18   Ht _0  (1.88 m)   Wt 106.6 kg   SpO2 99%   BMI 30.17 kg/m   Physical Exam Constitutional:      Appearance: Normal appearance.  Cardiovascular:     Rate and Rhythm: Normal rate and regular rhythm.     Pulses: Normal pulses.     Heart sounds: Normal heart sounds.  Pulmonary:     Effort: Pulmonary effort is normal.     Breath sounds: Normal breath sounds.  Abdominal:     General: Abdomen is flat. There is no distension.     Palpations: Abdomen is soft.     Tenderness: There is no abdominal tenderness.  Skin:    General: Skin is warm and  dry.  Neurological:     General: No focal deficit present.     Mental Status: He is alert and oriented to person, place, and time.  Psychiatric:        Mood and Affect: Mood normal.        Judgment: Judgment normal.     Imaging: US Abdomen Limited Ruq  Result Date: 09/10/2019 CLINICAL DATA:  38 year old male with elevated LFTs. EXAM: ULTRASOUND ABDOMEN LIMITED RIGHT UPPER QUADRANT COMPARISON:  Right upper quadrant ultrasound dated 01/23/2010 FINDINGS: Gallbladder: No gallstones or wall thickening visualized. No sonographic Murphy sign noted by sonographer. Common bile duct: Diameter: 3 mm Liver: There is diffuse increased liver echogenicity which may be related to fatty infiltration. Superimposed inflammation or fibrosis is not excluded. Clinical correlation is recommended. Portal vein is patent on color Doppler imaging with normal direction of blood flow towards the liver. Other: None.  IMPRESSION: Fatty liver, otherwise unremarkable right upper quadrant ultrasound. Electronically Signed   By: Anner Crete M.D.   On: 09/10/2019 11:13    Labs:  CBC: Recent Labs    10/09/19 1236  WBC 6.4  HGB 17.0  HCT 48.7  PLT 315    COAGS: Recent Labs    10/09/19 1236  INR 1.0    Assessment and Plan: Elevated LFTs For US guided random liver biopsy Labs ok Risks and benefits of liver biopsy was discussed with the patient and/or patient's family including, but not limited to bleeding, infection, damage to adjacent structures or low yield requiring additional tests.  All of the questions were answered and there is agreement to proceed.  Consent signed and in chart.    Thank you for this interesting consult.  I greatly enjoyed meeting [name] and look forward to participating in their care.  A copy of this report was sent to the requesting provider on this date.  Electronically Signed: Ascencion Dike, PA-C 10/09/2019, 12:56 PM   I spent a total of 25 minutes in face to face in clinical consultation, greater than 50% of which was counseling/coordinating care for liver biopsy

## 2019-10-09 NOTE — Procedures (Signed)
Interventional Radiology Procedure Note  Procedure: US guided liver biopsy, medical liver.  Complications: None Recommendations:  - Ok to shower tomorrow - Do not submerge for 7 days - Routine care   Signed,  Sultana Tierney S. Mana Haberl, DO    

## 2019-10-09 NOTE — Discharge Instructions (Signed)
Liver Biopsy, Care After  These instructions give you information on caring for yourself after your procedure. Your doctor may also give you more specific instructions. Call your doctor if you have any problems or questions after your procedure.  What can I expect after the procedure?  After the procedure, it is common to have:  · Pain and soreness where the biopsy was done.  · Bruising around the area where the biopsy was done.  · Sleepiness and be tired for a few days.  Follow these instructions at home:  Medicines  · Take over-the-counter and prescription medicines only as told by your doctor.  · If you were prescribed an antibiotic medicine, take it as told by your doctor. Do not stop taking the antibiotic even if you start to feel better.  · Do not take medicines such as aspirin and ibuprofen. These medicines can thin your blood. Do not take these medicines unless your doctor tells you to take them.  · If you are taking prescription pain medicine, take actions to prevent or treat constipation. Your doctor may recommend that you:  ? Drink enough fluid to keep your pee (urine) clear or pale yellow.  ? Take over-the-counter or prescription medicines.  ? Eat foods that are high in fiber, such as fresh fruits and vegetables, whole grains, and beans.  ? Limit foods that are high in fat and processed sugars, such as fried and sweet foods.  Caring for your cut  · Follow instructions from your doctor about how to take care of your cuts from surgery (incisions). Make sure you:  ? Wash your hands with soap and water before you change your bandage (dressing). If you cannot use soap and water, use hand sanitizer.  ? Change your bandage as told by your doctor.  ? Leave stitches (sutures), skin glue, or skin tape (adhesive) strips in place. They may need to stay in place for 2 weeks or longer. If tape strips get loose and curl up, you may trim the loose edges. Do not remove tape strips completely unless your doctor says it is  okay.  · Check your cuts every day for signs of infection. Check for:  ? Redness, swelling, or more pain.  ? Fluid or blood.  ? Pus or a bad smell.  ? Warmth.  · Do not take baths, swim, or use a hot tub until your doctor says it is okay to do so.  Activity    · Rest at home for 1-2 days or as told by your doctor.  ? Avoid sitting for a long time without moving. Get up to take short walks every 1-2 hours.  · Return to your normal activities as told by your doctor. Ask what activities are safe for you.  · Do not do these things in the first 24 hours:  ? Drive.  ? Use machinery.  ? Take a bath or shower.  · Do not lift more than 10 pounds (4.5 kg) or play contact sports for the first 2 weeks.  General instructions    · Do not drink alcohol in the first week after the procedure.  · Have someone stay with you for at least 24 hours after the procedure.  · Get your test results. Ask your doctor or the department that is doing the test:  ? When will my results be ready?  ? How will I get my results?  ? What are my treatment options?  ? What other tests do   I need?  ? What are my next steps?  · Keep all follow-up visits as told by your doctor. This is important.  Contact a doctor if:  · A cut bleeds and leaves more than just a small spot of blood.  · A cut is red, puffs up (swells), or hurts more than before.  · Fluid or something else comes from a cut.  · A cut smells bad.  · You have a fever or chills.  Get help right away if:  · You have swelling, bloating, or pain in your belly (abdomen).  · You get dizzy or faint.  · You have a rash.  · You feel sick to your stomach (nauseous) or throw up (vomit).  · You have trouble breathing, feel short of breath, or feel faint.  · Your chest hurts.  · You have problems talking or seeing.  · You have trouble with your balance or moving your arms or legs.  Summary  · After the procedure, it is common to have pain, soreness, bruising, and tiredness.  · Your doctor will tell you how to  take care of yourself at home. Change your bandage, take your medicines, and limit your activities as told by your doctor.  · Call your doctor if you have symptoms of infection. Get help right away if your belly swells, your cut bleeds a lot, or you have trouble talking or breathing.  This information is not intended to replace advice given to you by your health care provider. Make sure you discuss any questions you have with your health care provider.  Document Released: 08/16/2008 Document Revised: 11/17/2017 Document Reviewed: 11/17/2017  Elsevier Patient Education © 2020 [employer]

## 2019-10-10 LAB — SURGICAL PATHOLOGY

## 2020-09-14 IMAGING — US US BIOPSY CORE LIVER
1 series · 12 of 12 positions shown · non-contrast
Comparison: none

INDICATION: 38-year-old male with a history of transaminitis

[Series 1: us biopsy core liver · 12 of 12 slices shown]
[im 1/12]
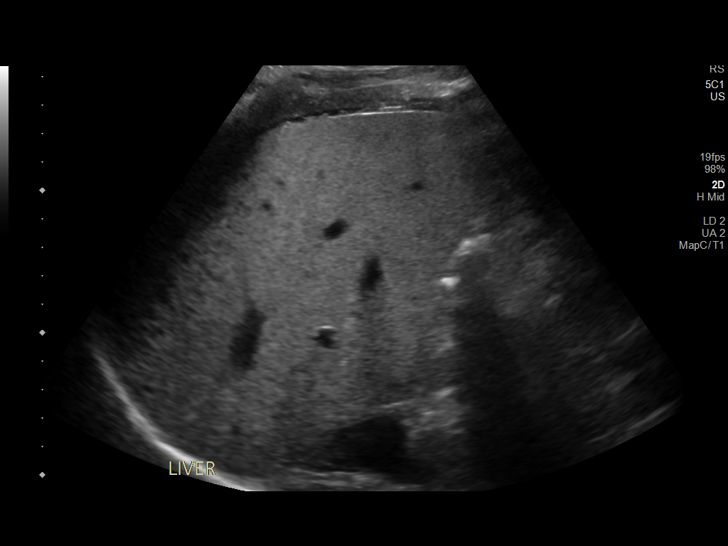
[im 2/12]
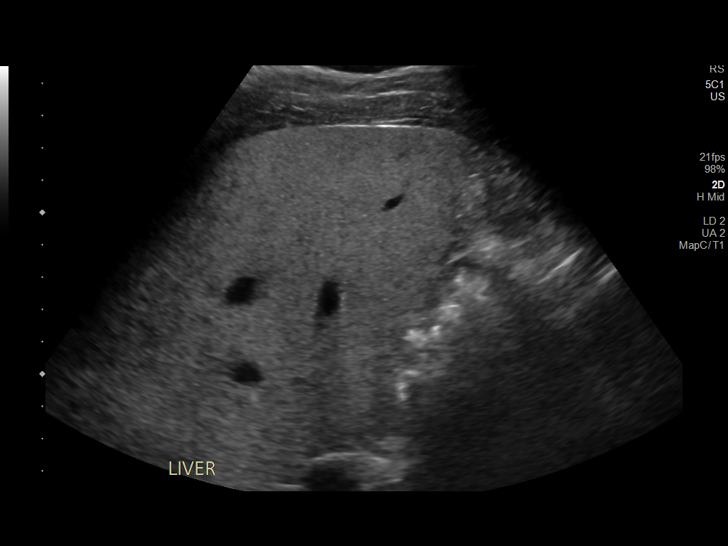
[im 3/12]
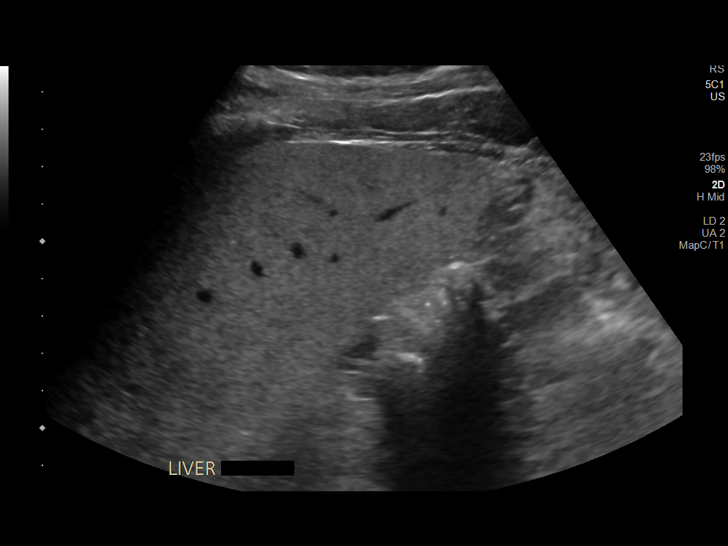
[im 4/12]
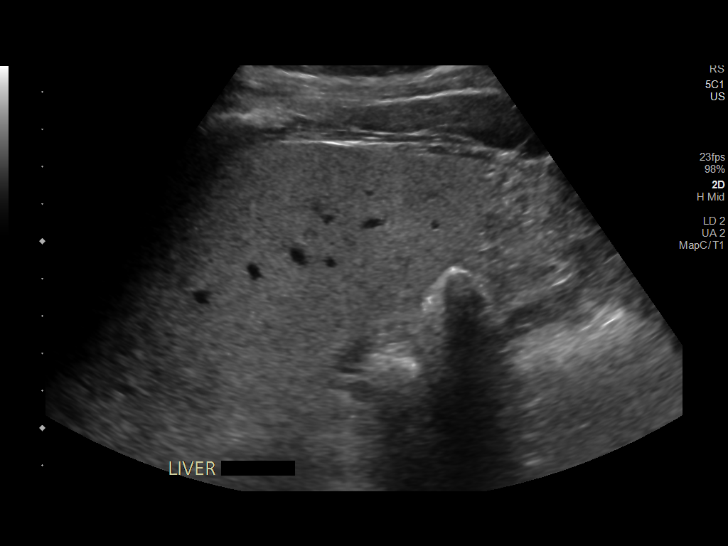
[im 5/12]
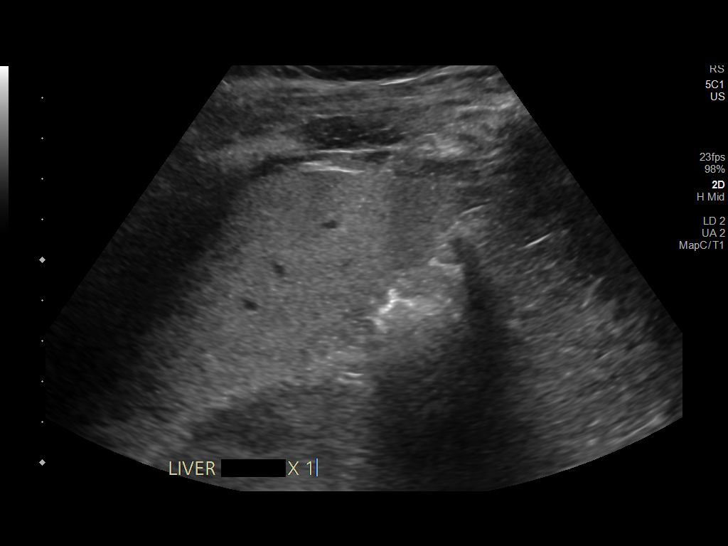
[im 6/12]
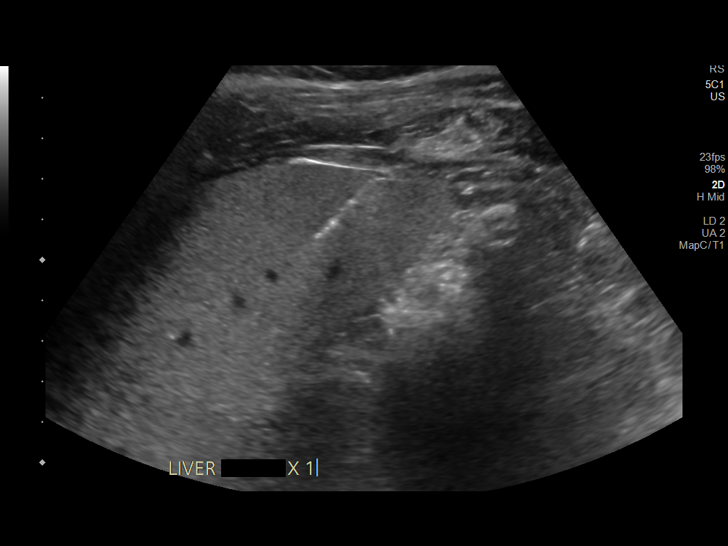
[im 7/12]
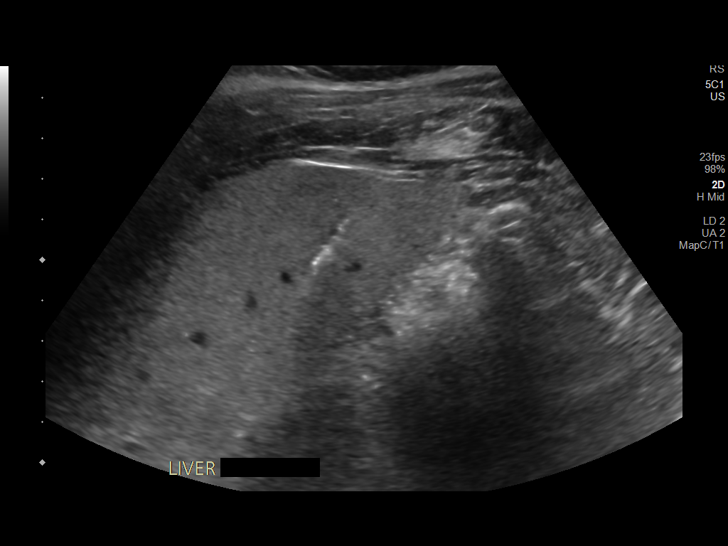
[im 8/12]
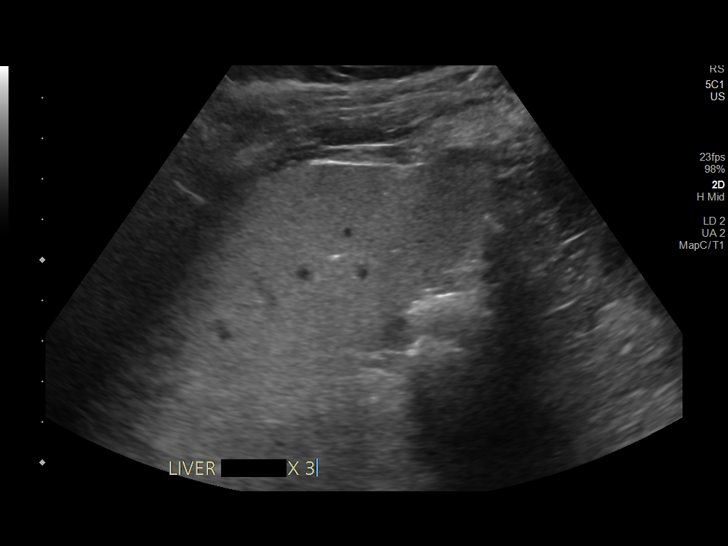
[im 9/12]
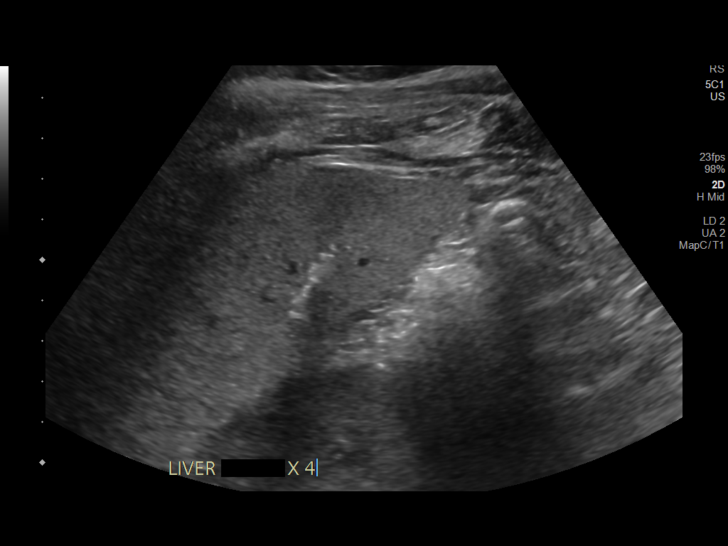
[im 10/12]
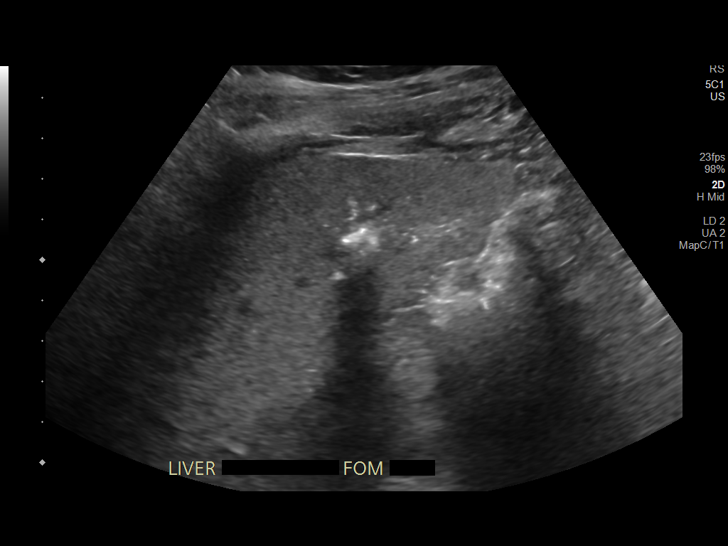
[im 11/12]
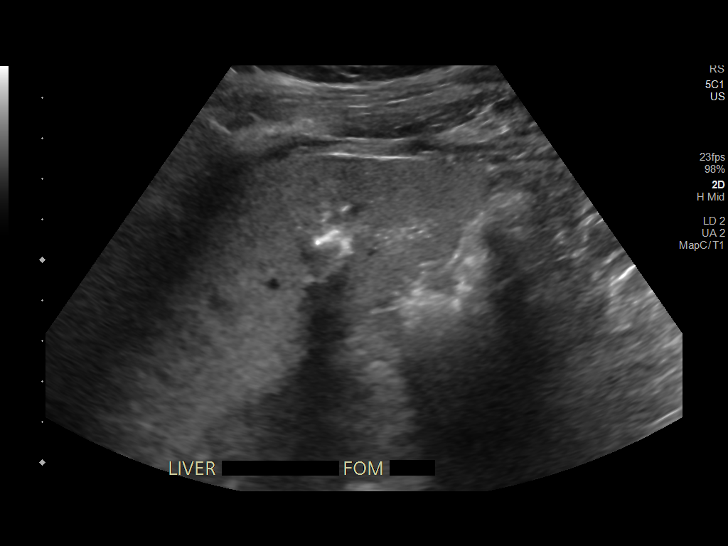
[im 12/12]
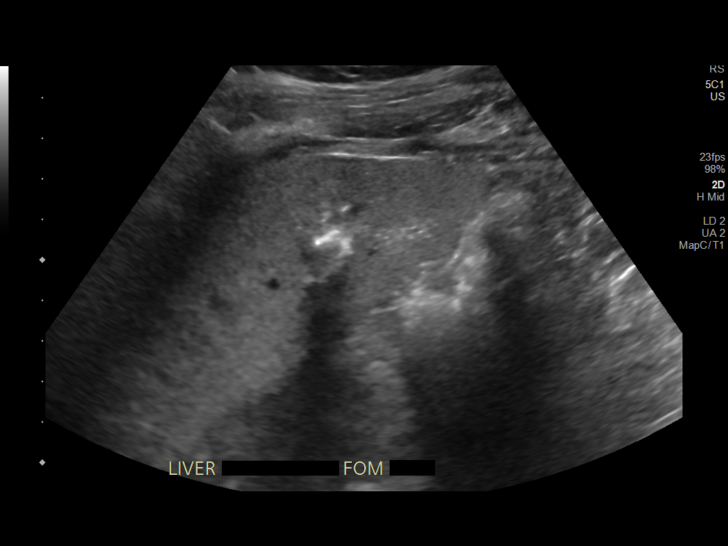

[12 of 12 positions shown; findings below may reference images not displayed]

EXAM:
ULTRASOUND-GUIDED MEDICAL LIVER BIOPSY

MEDICATIONS:
None.

ANESTHESIA/SEDATION:
Moderate (conscious) sedation was employed during this procedure. A
total of Versed 1.0 mg and Fentanyl 25 mcg was administered
intravenously.

Moderate Sedation Time: 10 minutes. The patient's level of
consciousness and vital signs were monitored continuously by
radiology nursing throughout the procedure under my direct
supervision.

FLUOROSCOPY TIME:  Ultrasound

COMPLICATIONS:
None

PROCEDURE:
The procedure, risks, benefits, and alternatives were explained to
the patient. Questions regarding the procedure were encouraged and
answered. The patient understands and consents to the procedure.

Ultrasound survey of the right liver lobe performed with images
stored and sent to PACs.

The right lower thorax/right upper abdomen was prepped with
chlorhexidine in a sterile fashion, and a sterile drape was applied
covering the operative field. A sterile gown and sterile gloves were
used for the procedure. Local anesthesia was provided with 1%
Lidocaine.

Once the patient is prepped and draped sterilely and the skin and
subcutaneous tissues were generously infiltrated with 1% lidocaine,
a small stab incision was made with an 11 blade scalpel.

A 17 gauge introducer needle was advanced under ultrasound guidance
in an intercostal location into the right liver lobe. The stylet was
removed, and 3 separate 18 gauge core biopsy were retrieved. Samples
were placed into formalin for transportation to the lab.

Three separate Gel-Foam pledgets were then infused with a small
amount of saline for assistance with hemostasis.

The needle was removed, and a final ultrasound image was performed.

The patient tolerated the procedure well and remained
hemodynamically stable throughout.

No complications were encountered and no significant blood loss was
encounter.
IMPRESSION: Status post ultrasound-guided medical liver biopsy.

## 2022-06-08 DIAGNOSIS — I219 Acute myocardial infarction, unspecified: Secondary | ICD-10-CM

## 2022-06-08 HISTORY — DX: Acute myocardial infarction, unspecified: I21.9

## 2022-06-28 DIAGNOSIS — I4510 Unspecified right bundle-branch block: Secondary | ICD-10-CM

## 2022-06-28 HISTORY — DX: Unspecified right bundle-branch block: I45.10

## 2022-06-28 HISTORY — PX: CORONARY ANGIOPLASTY: SHX604

## 2022-11-01 ENCOUNTER — Encounter (HOSPITAL_COMMUNITY): Payer: Self-pay | Admitting: Orthopedic Surgery

## 2022-11-01 ENCOUNTER — Other Ambulatory Visit: Payer: Self-pay

## 2022-11-01 ENCOUNTER — Other Ambulatory Visit: Payer: Self-pay | Admitting: Orthopedic Surgery

## 2022-11-01 NOTE — Progress Notes (Signed)
Anesthesia Chart Review   Case: [id number] Date/Time: 11/03/22 1146   Procedure: SHOULDER ARTHROSCOPY WITH ROTATOR CUFF REPAIR AND SUBACROMIAL DECOMPRESSION (Left)   Anesthesia type: Choice   Pre-op diagnosis: LEFT SHOULDER ROTATOR CUFF TEAR, IMPINGEMENT   Location: [facility] ROOM 07 / [facility]   Surgeons: Tania Ade, MD       DISCUSSION:41 y.o. with h/o HTN, sleep apnea, CAD (DES), left shoulder rotator cuff tear scheduled for above procedure 11/03/2022 with Dr. Tania Ade.   Pt was an add-on, not seen in PAT clinic. Necessary labs to be obtained DOS.   He has a history of MI (06/08/2022) and PCI with DES to proximal to mid left circumflex segment (06/09/22) and DES to proximal LAD lesion (06/28/22)   Pt was last seen by cardiology 10/18/2022.  Per note pt experiencing improvement of exertional tolerance, denies chest pain or shortness of breath.  He is experiencing palpitations lasting about 10 minutes with no associated syncope or presyncope.  Holter monitor order placed.    Dr. Bettina Gavia office sent a request for clearance to cardio.  Cardio returned, but did not mark optimized. Per cardiology note he should not discontinue asa and brilinta at this time.  Discussed with Dr. Bettina Gavia office.  Per Dr. Bettina Gavia scheduler Dr. Tamera Punt is ok accepting the risk or proceeding with arthroscopy regardless of pt only being four months out from DES and not able to discontinue DAPT.states Dr. Tamera Punt discussed risk with pt who would like to proceed as well.  Discussed risk of same day cancellation by anesthesia after evaluation DOS.  Informed Dr. Marcie Bal.   VS: There were no vitals taken for this visit.  PROVIDERS: Briscoe Deutscher, MD is PCP  Loni Beckwith, MD is Cardiologist  LABS: Labs reviewed: Acceptable for surgery. (all labs ordered are listed, but only abnormal results are displayed)  Labs Reviewed - No data to display   IMAGES:   EKG:   CV: Echo 06/09/2022 Left Ventricle:  Systolic function is low normal. EF: 50-55%. Ejection  fraction measured by 3D is 61%,    Left Ventricle: Wall motion is normal.    Left Atrium: Left atrium is mildly dilated at 4.100 cmLeft atrium  volume index is normal (16-34 mL/m2).   Echo 01/25/2010  - Left ventricle: Systolic function was normal. The estimated     ejection fraction was in the range of 55% to 60%.   - Atrial septum: There was increased thickness of the septum,     consistent with lipomatous hypertrophy.  Past Medical History:  Diagnosis Date   Coronary artery disease    Heart palpitations    Hypertension    Myocardial infarction ([place]) 06/08/2022   non-STEMI   Sleep apnea     Past Surgical History:  Procedure Laterality Date   CORONARY ANGIOPLASTY  06/28/2022   INGUINAL HERNIA REPAIR  2020   SPINAL FUSION  2020    MEDICATIONS: No current facility-administered medications for this encounter.    cholecalciferol (VITAMIN D3) 25 MCG (1000 UT) tablet   Testosterone Cypionate 200 MG/ML KIT    Kayleeann Huxford Ward, PA-C [facility] [phone]

## 2022-11-01 NOTE — Progress Notes (Addendum)
For Short Stay: COVID SWAB appointment date: N/A  Bowel Prep reminder:N/A   For Anesthesia: PCP - [facility] Cardiologist - Dr. Tarri Abernethy, Wayne Both, PA-C last office visit note 10/18/22 in [facility]    Chest x-ray - 06/08/22 in [facility] EKG - 06/28/22 [facility] in hard chart Stress Test - N/A ECHO - 06/09/22 in [facility] Cardiac Cath - 06/28/22 in [facility] Pacemaker/ICD device last checked: N/A Pacemaker orders received: N/A Device Rep notified:N/A  Spinal Cord Stimulator: N/A  Sleep Study - Yes  CPAP - Yes  Fasting Blood Sugar - N/A Checks Blood Sugar ___N/A__ times a day Date and result of last Hgb A1c- N/A  Last dose of GLP1 agonist- N/A GLP1 instructions: N/A  Last dose of SGLT-2 inhibitors- N/A SGLT-2 instructions:N/A  Blood Thinner Instructions:  ticagrelor (BRILINTA) will continue taking Aspirin Instructions: yes Last Dose: continue   Activity level:  Able to exercise without chest pain and/or shortness of breath      Anesthesia review: History of MI 05/2022, history of encephalopathy, HTN  Patient denies shortness of breath, fever, cough and chest pain at PAT appointment   Patient verbalized understanding of instructions reviewed via telephone.

## 2022-11-02 ENCOUNTER — Encounter (HOSPITAL_COMMUNITY): Payer: Self-pay | Admitting: Orthopedic Surgery

## 2022-11-03 ENCOUNTER — Encounter (HOSPITAL_COMMUNITY)
Admission: RE | Disposition: A | Discharge status: Home / Self Care | Payer: Self-pay | Source: Home / Self Care | Attending: Orthopedic Surgery

## 2022-11-03 ENCOUNTER — Other Ambulatory Visit: Payer: Self-pay

## 2022-11-03 ENCOUNTER — Encounter (HOSPITAL_COMMUNITY): Payer: Self-pay | Admitting: Orthopedic Surgery

## 2022-11-03 ENCOUNTER — Ambulatory Visit (HOSPITAL_BASED_OUTPATIENT_CLINIC_OR_DEPARTMENT_OTHER): Payer: 59 | Admitting: Physician Assistant

## 2022-11-03 ENCOUNTER — Ambulatory Visit (HOSPITAL_COMMUNITY): Payer: 59 | Admitting: Physician Assistant

## 2022-11-03 ENCOUNTER — Ambulatory Visit (HOSPITAL_COMMUNITY)
Admission: RE | Admit: 2022-11-03 | Discharge: 2022-11-03 | Disposition: A | Discharge status: Home / Self Care | Payer: 59 | Attending: Orthopedic Surgery | Admitting: Orthopedic Surgery

## 2022-11-03 DIAGNOSIS — I252 Old myocardial infarction: Secondary | ICD-10-CM

## 2022-11-03 DIAGNOSIS — M75102 Unspecified rotator cuff tear or rupture of left shoulder, not specified as traumatic: Secondary | ICD-10-CM

## 2022-11-03 DIAGNOSIS — I10 Essential (primary) hypertension: Secondary | ICD-10-CM | POA: Diagnosis not present

## 2022-11-03 DIAGNOSIS — Z87891 Personal history of nicotine dependence: Secondary | ICD-10-CM | POA: Diagnosis not present

## 2022-11-03 DIAGNOSIS — M75122 Complete rotator cuff tear or rupture of left shoulder, not specified as traumatic: Secondary | ICD-10-CM | POA: Diagnosis present

## 2022-11-03 DIAGNOSIS — M25812 Other specified joint disorders, left shoulder: Secondary | ICD-10-CM | POA: Insufficient documentation

## 2022-11-03 DIAGNOSIS — I2510 Atherosclerotic heart disease of native coronary artery without angina pectoris: Secondary | ICD-10-CM | POA: Insufficient documentation

## 2022-11-03 DIAGNOSIS — M7542 Impingement syndrome of left shoulder: Secondary | ICD-10-CM | POA: Diagnosis not present

## 2022-11-03 DIAGNOSIS — X58XXXA Exposure to other specified factors, initial encounter: Secondary | ICD-10-CM | POA: Insufficient documentation

## 2022-11-03 DIAGNOSIS — S43432A Superior glenoid labrum lesion of left shoulder, initial encounter: Secondary | ICD-10-CM | POA: Insufficient documentation

## 2022-11-03 HISTORY — DX: Atherosclerotic heart disease of native coronary artery without angina pectoris: I25.10

## 2022-11-03 HISTORY — DX: Inflammatory liver disease, unspecified: K75.9

## 2022-11-03 HISTORY — DX: Palpitations: R00.2

## 2022-11-03 HISTORY — DX: Essential (primary) hypertension: I10

## 2022-11-03 HISTORY — DX: Unspecified convulsions: R56.9

## 2022-11-03 HISTORY — PX: SHOULDER ARTHROSCOPY WITH ROTATOR CUFF REPAIR AND SUBACROMIAL DECOMPRESSION: SHX5686

## 2022-11-03 HISTORY — DX: Sleep apnea, unspecified: G47.30

## 2022-11-03 HISTORY — DX: Personal history of urinary calculi: Z87.442

## 2022-11-03 HISTORY — DX: Encephalopathy, unspecified: G93.40

## 2022-11-03 SURGERY — SHOULDER ARTHROSCOPY WITH ROTATOR CUFF REPAIR AND SUBACROMIAL DECOMPRESSION
Anesthesia: General | Site: Shoulder | Laterality: Left

## 2022-11-03 MED ORDER — DEXMEDETOMIDINE HCL IN NACL 80 MCG/20ML IV SOLN
INTRAVENOUS | Status: AC
  Filled 2022-11-03: qty 20

## 2022-11-03 MED ORDER — DEXAMETHASONE SODIUM PHOSPHATE 10 MG/ML IJ SOLN
INTRAMUSCULAR | Status: AC
  Filled 2022-11-03: qty 1

## 2022-11-03 MED ORDER — PHENYLEPHRINE HCL-NACL 20-0.9 MG/250ML-% IV SOLN
INTRAVENOUS | Status: DC | PRN
  Administered 2022-11-03: 30 ug/min via INTRAVENOUS

## 2022-11-03 MED ORDER — ONDANSETRON HCL 4 MG/2ML IJ SOLN
INTRAMUSCULAR | Status: AC
  Filled 2022-11-03: qty 2

## 2022-11-03 MED ORDER — BUPIVACAINE LIPOSOME 1.3 % IJ SUSP
INTRAMUSCULAR | Status: DC | PRN
  Administered 2022-11-03: 10 mL via PERINEURAL

## 2022-11-03 MED ORDER — MIDAZOLAM HCL 5 MG/5ML IJ SOLN
INTRAMUSCULAR | Status: DC | PRN
  Administered 2022-11-03 (×2): 1 mg via INTRAVENOUS

## 2022-11-03 MED ORDER — FENTANYL CITRATE (PF) 100 MCG/2ML IJ SOLN
INTRAMUSCULAR | Status: AC
  Filled 2022-11-03: qty 2

## 2022-11-03 MED ORDER — ROCURONIUM BROMIDE 100 MG/10ML IV SOLN
INTRAVENOUS | Status: DC | PRN
  Administered 2022-11-03: 10 mg via INTRAVENOUS
  Administered 2022-11-03: 60 mg via INTRAVENOUS
  Administered 2022-11-03: 30 mg via INTRAVENOUS

## 2022-11-03 MED ORDER — SODIUM CHLORIDE 0.9 % IR SOLN
Status: DC | PRN
  Administered 2022-11-03: 6000 mL

## 2022-11-03 MED ORDER — ACETAMINOPHEN 160 MG/5ML PO SOLN: 325.0000 mg | ORAL | Status: DC | PRN

## 2022-11-03 MED ORDER — PROPOFOL 10 MG/ML IV BOLUS
INTRAVENOUS | Status: DC | PRN
  Administered 2022-11-03: 200 mg via INTRAVENOUS

## 2022-11-03 MED ORDER — SUGAMMADEX SODIUM 500 MG/5ML IV SOLN
INTRAVENOUS | Status: AC
  Filled 2022-11-03: qty 5

## 2022-11-03 MED ORDER — LIDOCAINE HCL (PF) 2 % IJ SOLN
INTRAMUSCULAR | Status: AC
  Filled 2022-11-03: qty 5

## 2022-11-03 MED ORDER — PHENYLEPHRINE HCL-NACL 20-0.9 MG/250ML-% IV SOLN
INTRAVENOUS | Status: AC
  Filled 2022-11-03: qty 250

## 2022-11-03 MED ORDER — CHLORHEXIDINE GLUCONATE 0.12 % MT SOLN
15.0000 mL | Freq: Once | OROMUCOSAL | Status: AC
  Administered 2022-11-03: 15 mL via OROMUCOSAL

## 2022-11-03 MED ORDER — CEFAZOLIN SODIUM-DEXTROSE 2-3 GM-%(50ML) IV SOLR
INTRAVENOUS | Status: DC | PRN
  Administered 2022-11-03: 2 g via INTRAVENOUS

## 2022-11-03 MED ORDER — FENTANYL CITRATE PF 50 MCG/ML IJ SOSY
50.0000 ug | PREFILLED_SYRINGE | INTRAMUSCULAR | Status: DC
  Administered 2022-11-03: 100 ug via INTRAVENOUS
  Filled 2022-11-03: qty 2

## 2022-11-03 MED ORDER — FENTANYL CITRATE (PF) 100 MCG/2ML IJ SOLN
INTRAMUSCULAR | Status: DC | PRN
  Administered 2022-11-03: 50 ug via INTRAVENOUS
  Administered 2022-11-03: 5 ug via INTRAVENOUS

## 2022-11-03 MED ORDER — ACETAMINOPHEN 325 MG PO TABS: 325.0000 mg | ORAL_TABLET | ORAL | Status: DC | PRN

## 2022-11-03 MED ORDER — ORAL CARE MOUTH RINSE: 15.0000 mL | Freq: Once | OROMUCOSAL | Status: AC

## 2022-11-03 MED ORDER — MIDAZOLAM HCL 2 MG/2ML IJ SOLN
1.0000 mg | INTRAMUSCULAR | Status: DC
  Administered 2022-11-03: 2 mg via INTRAVENOUS
  Filled 2022-11-03: qty 2

## 2022-11-03 MED ORDER — ONDANSETRON HCL 4 MG/2ML IJ SOLN
INTRAMUSCULAR | Status: DC | PRN
  Administered 2022-11-03: 4 mg via INTRAVENOUS

## 2022-11-03 MED ORDER — SUGAMMADEX SODIUM 200 MG/2ML IV SOLN
INTRAVENOUS | Status: DC | PRN
  Administered 2022-11-03: 250 mg via INTRAVENOUS

## 2022-11-03 MED ORDER — MIDAZOLAM HCL 2 MG/2ML IJ SOLN
INTRAMUSCULAR | Status: AC
  Filled 2022-11-03: qty 2

## 2022-11-03 MED ORDER — METHOCARBAMOL 500 MG PO TABS: 500.0000 mg | ORAL_TABLET | Freq: Three times a day (TID) | ORAL | 0 refills | Status: AC | PRN

## 2022-11-03 MED ORDER — ROCURONIUM BROMIDE 10 MG/ML (PF) SYRINGE
PREFILLED_SYRINGE | INTRAVENOUS | Status: AC
  Filled 2022-11-03: qty 10

## 2022-11-03 MED ORDER — LACTATED RINGERS IV SOLN: INTRAVENOUS | Status: DC

## 2022-11-03 MED ORDER — OXYCODONE HCL 5 MG PO TABS: 5.0000 mg | ORAL_TABLET | Freq: Once | ORAL | Status: DC | PRN

## 2022-11-03 MED ORDER — PROPOFOL 10 MG/ML IV BOLUS
INTRAVENOUS | Status: AC
  Filled 2022-11-03: qty 20

## 2022-11-03 MED ORDER — ONDANSETRON HCL 4 MG/2ML IJ SOLN: 4.0000 mg | Freq: Once | INTRAMUSCULAR | Status: DC | PRN

## 2022-11-03 MED ORDER — DEXAMETHASONE SODIUM PHOSPHATE 10 MG/ML IJ SOLN
INTRAMUSCULAR | Status: DC | PRN
  Administered 2022-11-03: 8 mg via INTRAVENOUS

## 2022-11-03 MED ORDER — OXYCODONE HCL 5 MG PO TABS: 5.0000 mg | ORAL_TABLET | ORAL | 0 refills | Status: AC | PRN

## 2022-11-03 MED ORDER — FENTANYL CITRATE PF 50 MCG/ML IJ SOSY: 25.0000 ug | PREFILLED_SYRINGE | INTRAMUSCULAR | Status: DC | PRN

## 2022-11-03 MED ORDER — VANCOMYCIN HCL IN DEXTROSE 1-5 GM/200ML-% IV SOLN
1000.0000 mg | INTRAVENOUS | Status: AC
  Administered 2022-11-03: 1000 mg via INTRAVENOUS
  Filled 2022-11-03: qty 200

## 2022-11-03 MED ORDER — BUPIVACAINE HCL (PF) 0.5 % IJ SOLN
INTRAMUSCULAR | Status: DC | PRN
  Administered 2022-11-03: 15 mL via PERINEURAL

## 2022-11-03 MED ORDER — DEXMEDETOMIDINE HCL IN NACL 80 MCG/20ML IV SOLN
INTRAVENOUS | Status: DC | PRN
  Administered 2022-11-03: 8 ug via BUCCAL

## 2022-11-03 MED ORDER — OXYCODONE HCL 5 MG/5ML PO SOLN: 5.0000 mg | Freq: Once | ORAL | Status: DC | PRN

## 2022-11-03 MED ORDER — LIDOCAINE HCL (CARDIAC) PF 100 MG/5ML IV SOSY
PREFILLED_SYRINGE | INTRAVENOUS | Status: DC | PRN
  Administered 2022-11-03: 100 mg via INTRAVENOUS

## 2022-11-03 MED ORDER — MEPERIDINE HCL 50 MG/ML IJ SOLN: 6.2500 mg | INTRAMUSCULAR | Status: DC | PRN

## 2022-11-03 SURGICAL SUPPLY — 64 items
ANCHOR PEEK 4.75X19.1 SWLK C (Anchor) IMPLANT
ANCHOR SUT SWIVELOCK 4.75X19.1 (Anchor) IMPLANT
BAG COUNTER SPONGE SURGICOUNT (BAG) IMPLANT
BOOTIES KNEE HIGH SLOAN (MISCELLANEOUS) ×2 IMPLANT
BURR OVAL 8 FLU 4.0X13 (MISCELLANEOUS) ×1 IMPLANT
CANNULA 5.75X7 CRYSTAL CLEAR (CANNULA) ×1 IMPLANT
CANNULA TWIST IN 8.25X7CM (CANNULA) IMPLANT
COOLER ICEMAN CLASSIC (MISCELLANEOUS) IMPLANT
COVER SURGICAL LIGHT HANDLE (MISCELLANEOUS) ×1 IMPLANT
CUTTER BONE 4.0MM X 13CM (MISCELLANEOUS) ×1 IMPLANT
DISSECTOR  3.8MM X 13CM (MISCELLANEOUS)
DISSECTOR 3.8MM X 13CM (MISCELLANEOUS) IMPLANT
DRAPE IMP U-DRAPE 54X76 (DRAPES) ×1 IMPLANT
DRAPE INCISE IOBAN 66X45 STRL (DRAPES) IMPLANT
DRAPE ORTHO SPLIT 77X108 STRL (DRAPES) ×2
DRAPE STERI 35X30 U-POUCH (DRAPES) ×1 IMPLANT
DRAPE SURG ORHT 6 SPLT 77X108 (DRAPES) ×2 IMPLANT
DRAPE U-SHAPE 47X51 STRL (DRAPES) ×2 IMPLANT
DURAPREP 26ML APPLICATOR (WOUND CARE) ×1 IMPLANT
ELECT REM PT RETURN 15FT ADLT (MISCELLANEOUS) ×1 IMPLANT
GAUZE PAD ABD 8X10 STRL (GAUZE/BANDAGES/DRESSINGS) ×2 IMPLANT
GAUZE SPONGE 4X4 12PLY STRL (GAUZE/BANDAGES/DRESSINGS) ×1 IMPLANT
GAUZE XEROFORM 1X8 LF (GAUZE/BANDAGES/DRESSINGS) ×1 IMPLANT
GLOVE BIO SURGEON STRL SZ7.5 (GLOVE) ×1 IMPLANT
GLOVE BIOGEL PI IND STRL 6.5 (GLOVE) ×1 IMPLANT
GLOVE BIOGEL PI IND STRL 8 (GLOVE) ×1 IMPLANT
GLOVE SURG POLYISO LF SZ6.5 (GLOVE) ×1 IMPLANT
GOWN STRL REUS W/ TWL LRG LVL3 (GOWN DISPOSABLE) ×1 IMPLANT
GOWN STRL REUS W/ TWL XL LVL3 (GOWN DISPOSABLE) ×1 IMPLANT
GOWN STRL REUS W/TWL LRG LVL3 (GOWN DISPOSABLE) ×1
GOWN STRL REUS W/TWL XL LVL3 (GOWN DISPOSABLE) ×1
KIT BASIN OR (CUSTOM PROCEDURE TRAY) ×1 IMPLANT
KIT PUSHLOCK 2.9 HIP (KITS) IMPLANT
KIT TURNOVER KIT A (KITS) IMPLANT
LASSO 90 CVE QUICKPAS (DISPOSABLE) IMPLANT
LASSO CRESCENT QUICKPASS (SUTURE) IMPLANT
MANIFOLD NEPTUNE II (INSTRUMENTS) ×1 IMPLANT
NDL HD SCORPION MEGA LOADER (NEEDLE) IMPLANT
NDL SCORPION MULTI FIRE (NEEDLE) IMPLANT
NEEDLE SCORPION MULTI FIRE (NEEDLE) IMPLANT
PACK ARTHROSCOPY WL (CUSTOM PROCEDURE TRAY) ×1 IMPLANT
PAD COLD SHLDR WRAP-ON (PAD) IMPLANT
PROBE BIPOLAR ATHRO 135MM 90D (MISCELLANEOUS) ×1 IMPLANT
PROTECTOR NERVE ULNAR (MISCELLANEOUS) ×1 IMPLANT
RESTRAINT HEAD UNIVERSAL NS (MISCELLANEOUS) IMPLANT
SLING ARM FOAM STRAP LRG (SOFTGOODS) IMPLANT
SLING ARM FOAM STRAP MED (SOFTGOODS) IMPLANT
SLING ARM IMMOBILIZER LRG (SOFTGOODS) IMPLANT
SLING ARM IMMOBILIZER MED (SOFTGOODS) IMPLANT
SUPPORT WRAP ARM LG (MISCELLANEOUS) ×1 IMPLANT
SUT ETHILON 3 0 PS 1 (SUTURE) ×1 IMPLANT
SUT PDS AB 1 CT1 27 (SUTURE) IMPLANT
SUT TIGER TAPE 7 IN WHITE (SUTURE) IMPLANT
SUTURE TAPE 1.3 40 TPR END (SUTURE) IMPLANT
SUTURETAPE 1.3 40 TPR END (SUTURE)
SUTURETAPE 1.3 40 W/NDL BLK/WH (SUTURE) IMPLANT
TAPE FIBER 2MM 7IN #2 BLUE (SUTURE) IMPLANT
TAPE LABRALWHITE 1.5X36 (TAPE) IMPLANT
TAPE SUT LABRALTAP WHT/BLK (SUTURE) IMPLANT
TOWEL OR 17X26 10 PK STRL BLUE (TOWEL DISPOSABLE) ×1 IMPLANT
TOWEL OR NON WOVEN STRL DISP B (DISPOSABLE) ×1 IMPLANT
TUBING ARTHROSCOPY IRRIG 16FT (MISCELLANEOUS) ×1 IMPLANT
TUBING CONNECTING 10 (TUBING) ×2 IMPLANT
WAND ABLATOR APOLLO I90 (BUR) IMPLANT

## 2022-11-03 NOTE — Anesthesia Procedure Notes (Signed)
Anesthesia Regional Block: Interscalene brachial plexus block   Pre-Anesthetic Checklist: , timeout performed,  Correct Patient, Correct Site, Correct Laterality,  Correct Procedure, Correct Position, site marked,  Risks and benefits discussed,  Surgical consent,  Pre-op evaluation,  At surgeon's request and post-op pain management  Laterality: Left  Prep: chloraprep       Needles:  Injection technique: Single-shot  Needle Type: Echogenic Stimulator Needle     Needle Length: 5cm  Needle Gauge: 22     Additional Needles:   Procedures:, nerve stimulator,,, ultrasound used (permanent image in chart),,     Nerve Stimulator or Paresthesia:  Response: hand, 0.45 mA  Additional Responses:   Narrative:  Start time: 11/03/2022 11:55 AM End time: 11/03/2022 12:01 PM Injection made incrementally with aspirations every 5 mL.  Performed by: Personally  Anesthesiologist: Bethena Midget, MD  Additional Notes: Functioning IV was confirmed and monitors were applied.  A 91mm 22ga Arrow echogenic stimulator needle was used. Sterile prep and drape,hand hygiene and sterile gloves were used. Ultrasound guidance: relevant anatomy identified, needle position confirmed, local anesthetic spread visualized around nerve(s)., vascular puncture avoided.  Image printed for medical record. Negative aspiration and negative test dose prior to incremental administration of local anesthetic. The patient tolerated the procedure well.

## 2022-11-03 NOTE — Discharge Instructions (Addendum)
Discharge Instructions after Arthroscopic Shoulder Repair   A sling has been provided for you. Remain in your sling at all times. This includes sleeping in your sling.  Use ice on the shoulder intermittently over the first 48 hours after surgery.  Pain medicine has been prescribed for you.  Use your medicine liberally over the first 48 hours, and then you can begin to taper your use. You may take Extra Strength Tylenol or Tylenol only in place of the pain pills. DO NOT take ANY nonsteroidal anti-inflammatory pain medications: Advil, Motrin, Ibuprofen, Aleve, Naproxen, or Narprosyn.  You may remove your dressing after two days. If the incision sites are still moist, place a Band-Aid over the moist site(s). Change Band-Aids daily until dry.  You may shower 5 days after surgery. The incisions CANNOT get wet prior to 5 days. Simply allow the water to wash over the site and then pat dry. Do not rub the incisions. Make sure your axilla (armpit) is completely dry after showering.     Please call [phone] during normal business hours or [phone] after hours for any problems. Including the following:  - excessive redness of the incisions - drainage for more than 4 days - fever of more than 101.5 F  *Please note that pain medications will not be refilled after hours or on weekends.

## 2022-11-03 NOTE — H&P (Signed)
Erik Murphy is an 41 y.o. male.   Chief Complaint: L shoulder pain HPI: L shoulder pain with small full thickness rotator cuff tear. Failed conservative tx.  Past Medical History:  Diagnosis Date   Coronary artery disease    Encephalopathy    Heart palpitations    Hepatitis    liver biospy did not confirm hepatitis   History of kidney stones    age 46 years   Hypertension    Incomplete right bundle branch block (RBBB) determined by electrocardiography 06/28/2022   Myocardial infarction ([place]) 06/08/2022   non-STEMI   Seizure ([place])    with acute encephalopathy   Sleep apnea     Past Surgical History:  Procedure Laterality Date   CORONARY ANGIOPLASTY  06/28/2022   INGUINAL HERNIA REPAIR  2020   SPINAL FUSION  2020    History reviewed. No pertinent family history. Social History:  reports that he has quit smoking. His smoking use included cigarettes. He has a 6.00 pack-year smoking history. He has never used smokeless tobacco. He reports current alcohol use. He reports that he does not use drugs.  Allergies:  Allergies  Allergen Reactions   Sulfonamide Derivatives     Childhood allergy    Amoxicillin Rash    Rash on torso and legs    Medications Prior to Admission  Medication Sig Dispense Refill   amphetamine-dextroamphetamine (ADDERALL) 15 MG tablet Take 15 mg by mouth 2 (two) times daily.     ASPIRIN LOW DOSE 81 MG tablet Take 81 mg by mouth daily.     atorvastatin (LIPITOR) 80 MG tablet Take 80 mg by mouth at bedtime.     BRILINTA 90 MG TABS tablet Take 90 mg by mouth 2 (two) times daily.     carvedilol (COREG) 3.125 MG tablet Take 3.125 mg by mouth 2 (two) times daily.     ibuprofen (ADVIL) 200 MG tablet Take 400 mg by mouth every 6 (six) hours as needed for moderate pain.     losartan (COZAAR) 25 MG tablet Take 25 mg by mouth daily.     Testosterone Cypionate 200 MG/ML KIT Inject 0.75 mLs into the muscle once a week.      No results found for this or any  previous visit (from the past 48 hour(s)). No results found.  Review of Systems  All other systems reviewed and are negative.   Blood pressure 131/83, pulse 80, temperature 98.6 F (37 C), temperature source Oral, resp. rate 19, height _0  (1.854 m), weight 106.6 kg, SpO2 99 %. Physical Exam Constitutional:      Appearance: He is well-developed.  HENT:     Head: Atraumatic.  Eyes:     Extraocular Movements: Extraocular movements intact.  Cardiovascular:     Pulses: Normal pulses.  Pulmonary:     Effort: Pulmonary effort is normal.  Musculoskeletal:     Comments: Left shoulder pain with rotator cuff testing.  Skin:    General: Skin is warm and dry.  Neurological:     Mental Status: He is alert and oriented to person, place, and time.  Psychiatric:        Mood and Affect: Mood normal.      Assessment/Plan Left shoulder full-thickness symptomatic rotator cuff tear, failed conservative treatment Plan left shoulder arthroscopic rotator cuff repair Risks of surgery were discussed at length in light of the fact that he is within 6 months of stenting for MI.  His cardiologist felt he could proceed with  the surgery as long as he stayed on the anticoagulant.  We discussed potential increased risk of bleeding events, however with arthroscopic procedure this risk is still relatively low.  Ultimately he wished to move forward with surgery rather than wait until he was off of the anticoagulants.  Rhae Hammock, MD 11/03/2022, 11:49 AM

## 2022-11-03 NOTE — Op Note (Signed)
Procedure(s): SHOULDER ARTHROSCOPY WITH ROTATOR CUFF REPAIR AND SUBACROMIAL DECOMPRESSION Procedure Note  Erik Murphy male 41 y.o. 11/03/2022  Preoperative diagnosis: #1 left shoulder full-thickness rotator cuff tear #2 left shoulder impingement with unfavorable acromial anatomy  Postoperative diagnosis: Numbers 1 and 2 same #3 left shoulder type I SLAP tear  Procedure performed: #1 left shoulder arthroscopic rotator cuff repair #2 left shoulder arthroscopic subacromial decompression #3 left shoulder arthroscopic debridement type I SLAP tear  Surgeon(s) and Role:    Jones Broom, MD - Primary     Surgeon: Glennon Hamilton   Assistants: Fredia Sorrow PA-C Amber was present and scrubbed throughout the procedure and was essential in positioning, assisting with the camera and instrumentation,, and closure)  Anesthesia: General endotracheal anesthesia with preoperative interscalene block given by the attending anesthesiologist     Procedure Detail  SHOULDER ARTHROSCOPY WITH ROTATOR CUFF REPAIR AND SUBACROMIAL DECOMPRESSION  Estimated Blood Loss: Min         Drains: none  Blood Given: none         Specimens: none        Complications:  * No complications entered in OR log *         Disposition: PACU - hemodynamically stable.         Condition: stable    Procedure:   INDICATIONS FOR SURGERY: The patient is 40 y.o. male who has had a long history of left shoulder pain which is failed conservative management.  He was found on MRI to have a full-thickness anterior supraspinatus tear.  Indicated for surgical treatment to decrease pain and improve function.  OPERATIVE FINDINGS: Examination under anesthesia: No stiffness or instability.   DESCRIPTION OF PROCEDURE: The patient was identified in preoperative  holding area where I personally marked the operative site after  verifying site, side, and procedure with the patient. An interscalene block was given  by the attending anesthesiologist the holding area.  The patient was taken back to the operating room where general anesthesia was induced without complication and was placed in the beach-chair position with the back  elevated about 60 degrees and all extremities and head and neck carefully padded and  positioned.   The left upper extremity was then prepped and  draped in a standard sterile fashion. The appropriate time-out  procedure was carried out. The patient did receive IV antibiotics  within 30 minutes of incision.   A small posterior portal incision was made and the arthroscope was introduced into the joint. An anterior portal was then established above the subscapularis using needle localization. Small cannula was placed anteriorly.   Glenohumeral joint surfaces were noted to be intact without any significant arthritic changes.  His subscapularis was completely intact as was the biceps.  He did have some extensive flap tearing of the superior labrum which was debrided back to healthy labrum.  There was not significant involvement of the biceps origin.  The undersurface of the supraspinatus was noted to have small full-thickness tear anteriorly just posteriorly to the biceps tendon.  The arthroscope was then introduced into the subacromial space a standard lateral portal was established with needle localization. The shaver was used through the lateral portal to perform extensive bursectomy. Coracoacromial ligament was examined and found to be frayed indicating impingement.  The bursal surface of the rotator cuff tear was identified and noted to be of an unusual pattern with a longitudinal split anteriorly.  The tuberosity was exposed and debrided down to a bleeding surface  to promote healing.  The repair was then carried out by placing one 4.75 peek swivel lock anchor into the tuberosity and passing 4 strands of suture tape through the tear edge.  The strands were then brought anteriorly over  the longitudinal split securing the tendon back down to its anatomic position on the tuberosity.  The repair was felt to be complete and watertight.  The coracoacromial ligament was taken down off the anterior acromion with the ArthroCare exposing a moderate hooked anterior acromial spur. A high-speed bur was then used through the lateral portal to take down the anterior acromial spur from lateral to medial in a standard acromioplasty.  The acromioplasty was also viewed from the lateral portal and the bur was used as necessary to ensure that the acromion was completely flat from posterior to anterior.  The arthroscopic equipment was removed from the joint and the portals were closed with 3-0 nylon in an interrupted fashion. Sterile dressings were then applied including Xeroform 4 x 4's ABDs and tape. The patient was then allowed to awaken from general anesthesia, placed in a sling, transferred to the stretcher and taken to the recovery room in stable condition.   POSTOPERATIVE PLAN: The patient will be discharged home today and will followup in one week for suture removal and wound check.  He will follow the standard cuff protocol.

## 2022-11-03 NOTE — Anesthesia Preprocedure Evaluation (Addendum)
Anesthesia Evaluation  Patient identified by MRN, date of birth, ID band Patient awake    Reviewed: Allergy & Precautions, H&P , NPO status , Patient's Chart, lab work & pertinent test results  Airway Mallampati: II  TM Distance: >3 FB Neck ROM: Full    Dental no notable dental hx. (+) Teeth Intact, Dental Advisory Given   Pulmonary former smoker   Pulmonary exam normal breath sounds clear to auscultation       Cardiovascular hypertension, + CAD and + Past MI  Normal cardiovascular exam Rhythm:Regular Rate:Normal  Echo 06/09/2022 Left Ventricle: Systolic function is low normal. EF: 50-55%. Ejection  fraction measured by 3D is 61%,    Left Ventricle: Wall motion is normal.    Left Atrium: Left atrium is mildly dilated at 4.100 cmLeft atrium  volume index is normal (16-34 mL/m2).     Neuro/Psych negative neurological ROS  negative psych ROS   GI/Hepatic negative GI ROS,,,  Endo/Other  negative endocrine ROS    Renal/GU negative Renal ROS  negative genitourinary   Musculoskeletal negative musculoskeletal ROS (+)    Abdominal   Peds negative pediatric ROS (+)  Hematology negative hematology ROS (+)   Anesthesia Other Findings   Reproductive/Obstetrics negative OB ROS                             Anesthesia Physical Anesthesia Plan  ASA: 3  Anesthesia Plan: General   Post-op Pain Management: Minimal or no pain anticipated and Regional block*   Induction: Intravenous  PONV Risk Score and Plan: 2 and Ondansetron, Dexamethasone and Treatment may vary due to age or medical condition  Airway Management Planned: Oral ETT  Additional Equipment: None  Intra-op Plan:   Post-operative Plan: Extubation in OR  Informed Consent: I have reviewed the patients History and Physical, chart, labs and discussed the procedure including the risks, benefits and alternatives for the proposed  anesthesia with the patient or authorized representative who has indicated his/her understanding and acceptance.     Dental advisory given  Plan Discussed with: Anesthesiologist and CRNA  Anesthesia Plan Comments: (See PAT note 11/01/22  He has a history of MI (06/08/2022) and PCI with DES to proximal to mid left circumflex segment (06/09/22) and DES to proximal LAD lesion (06/28/22)    Pt was last seen by cardiology 10/18/2022.  Per note pt experiencing improvement of exertional tolerance, denies chest pain or shortness of breath.  He is experiencing palpitations lasting about 10 minutes with no associated syncope or presyncope.  Holter monitor order placed.     Dr. Veda Canning office sent a request for clearance to cardio.  Cardio returned, but did not mark optimized. Per cardiology note he should not discontinue asa and brilinta at this time.  Discussed with Dr. Veda Canning office.  Per Dr. Veda Canning scheduler Dr. Ave Filter is ok accepting the risk or proceeding with arthroscopy regardless of pt only being four months out from DES and not able to discontinue DAPT.states Dr. Ave Filter discussed risk with pt who would like to proceed as well.  Discussed risk of same day cancellation by anesthesia after evaluation DOS.  Informed Dr. Chaney Malling.   I had a long discussion with Mr. Mullinax regarding having surgery inside of 6 months of an MI.  He is aware of the risks and wants to proceed regardless.  )       Anesthesia Quick Evaluation

## 2022-11-03 NOTE — Anesthesia Postprocedure Evaluation (Signed)
Anesthesia Post Note  Patient: Erik Murphy  Procedure(s) Performed: SHOULDER ARTHROSCOPY WITH ROTATOR CUFF REPAIR AND SUBACROMIAL DECOMPRESSION (Left: Shoulder)     Patient location during evaluation: PACU Anesthesia Type: General Level of consciousness: awake and alert Pain management: pain level controlled Vital Signs Assessment: post-procedure vital signs reviewed and stable Respiratory status: spontaneous breathing, nonlabored ventilation, respiratory function stable and patient connected to nasal cannula oxygen Cardiovascular status: blood pressure returned to baseline and stable Postop Assessment: no apparent nausea or vomiting Anesthetic complications: no  No notable events documented.  Last Vitals:  Vitals:   11/03/22 1500 11/03/22 1515  BP: 112/70 107/63  Pulse: 79 74  Resp: 16 14  Temp:  37 C  SpO2: 94% 95%    Last Pain:  Vitals:   11/03/22 1515  TempSrc:   PainSc: 0-No pain                 Oluwasemilore Pascuzzi

## 2022-11-03 NOTE — Transfer of Care (Signed)
Immediate Anesthesia Transfer of Care Note  Patient: Erik Murphy  Procedure(s) Performed: SHOULDER ARTHROSCOPY WITH ROTATOR CUFF REPAIR AND SUBACROMIAL DECOMPRESSION (Left: Shoulder)  Patient Location: PACU  Anesthesia Type:General  Level of Consciousness: awake, alert , oriented, and patient cooperative  Airway & Oxygen Therapy: Patient Spontanous Breathing and Patient connected to face mask oxygen  Post-op Assessment: Report given to RN and Post -op Vital signs reviewed and stable  Post vital signs: Reviewed and stable  Last Vitals:  Vitals Value Taken Time  BP 131/78 11/03/22 1410  Temp    Pulse 84 11/03/22 1412  Resp 19 11/03/22 1412  SpO2 99 % 11/03/22 1412  Vitals shown include unvalidated device data.  Last Pain:  Vitals:   11/03/22 1216  TempSrc:   PainSc: 0-No pain      Patients Stated Pain Goal: 4 (11/03/22 1041)  Complications: No notable events documented.

## 2022-11-03 NOTE — Anesthesia Procedure Notes (Signed)
Procedure Name: Intubation Date/Time: 11/03/2022 12:47 PM  Performed by: Garrel Ridgel, CRNAPre-anesthesia Checklist: Patient identified, Emergency Drugs available, Suction available and Patient being monitored Patient Re-evaluated:Patient Re-evaluated prior to induction Oxygen Delivery Method: Circle system utilized Preoxygenation: Pre-oxygenation with 100% oxygen Induction Type: IV induction Ventilation: Mask ventilation without difficulty Laryngoscope Size: Mac and 4 Grade View: Grade III Tube type: Oral Tube size: 7.5 mm Number of attempts: 1 Airway Equipment and Method: Stylet and Oral airway Placement Confirmation: ETT inserted through vocal cords under direct vision, positive ETCO2 and breath sounds checked- equal and bilateral Secured at: 24 cm Tube secured with: Tape Dental Injury: Teeth and Oropharynx as per pre-operative assessment

## 2022-11-04 ENCOUNTER — Encounter (HOSPITAL_COMMUNITY): Payer: Self-pay | Admitting: Orthopedic Surgery

## 2023-11-21 ENCOUNTER — Other Ambulatory Visit: Payer: Self-pay | Admitting: Family Medicine

## 2023-11-21 DIAGNOSIS — R7989 Other specified abnormal findings of blood chemistry: Secondary | ICD-10-CM

## 2023-12-04 ENCOUNTER — Ambulatory Visit
Admission: RE | Admit: 2023-12-04 | Discharge: 2023-12-04 | Disposition: A | Discharge status: Home / Self Care | Payer: 59 | Source: Ambulatory Visit | Attending: Family Medicine | Admitting: Family Medicine

## 2023-12-04 DIAGNOSIS — R7989 Other specified abnormal findings of blood chemistry: Secondary | ICD-10-CM
